# Patient Record
Sex: Male | Born: 1950 | Race: White | Hispanic: No
Health system: Southern US, Community
[De-identification: ages and names within clinical notes are randomized; demographics above are authoritative.]

---

## 2001-01-31 ENCOUNTER — Encounter: Payer: Self-pay | Admitting: Family Medicine

## 2001-01-31 ENCOUNTER — Ambulatory Visit (HOSPITAL_COMMUNITY): Admission: RE | Admit: 2001-01-31 | Discharge: 2001-01-31 | Payer: Self-pay | Admitting: Family Medicine

## 2001-03-14 ENCOUNTER — Ambulatory Visit (HOSPITAL_BASED_OUTPATIENT_CLINIC_OR_DEPARTMENT_OTHER): Admission: RE | Admit: 2001-03-14 | Discharge: 2001-03-14 | Payer: Self-pay | Admitting: Orthopaedic Surgery

## 2002-12-12 ENCOUNTER — Ambulatory Visit (HOSPITAL_BASED_OUTPATIENT_CLINIC_OR_DEPARTMENT_OTHER): Admission: RE | Admit: 2002-12-12 | Discharge: 2002-12-12 | Payer: Self-pay | Admitting: Internal Medicine

## 2006-09-21 ENCOUNTER — Ambulatory Visit (HOSPITAL_BASED_OUTPATIENT_CLINIC_OR_DEPARTMENT_OTHER): Admission: RE | Admit: 2006-09-21 | Discharge: 2006-09-21 | Payer: Self-pay | Admitting: Family Medicine

## 2006-09-25 ENCOUNTER — Ambulatory Visit: Payer: Self-pay | Admitting: Internal Medicine

## 2012-11-21 DIAGNOSIS — E785 Hyperlipidemia, unspecified: Secondary | ICD-10-CM | POA: Insufficient documentation

## 2015-09-21 DIAGNOSIS — H66001 Acute suppurative otitis media without spontaneous rupture of ear drum, right ear: Secondary | ICD-10-CM | POA: Diagnosis not present

## 2015-09-21 DIAGNOSIS — J029 Acute pharyngitis, unspecified: Secondary | ICD-10-CM | POA: Diagnosis not present

## 2015-12-29 DIAGNOSIS — G4733 Obstructive sleep apnea (adult) (pediatric): Secondary | ICD-10-CM | POA: Insufficient documentation

## 2016-01-06 DIAGNOSIS — G4733 Obstructive sleep apnea (adult) (pediatric): Secondary | ICD-10-CM | POA: Diagnosis not present

## 2016-01-06 DIAGNOSIS — R0683 Snoring: Secondary | ICD-10-CM | POA: Diagnosis not present

## 2016-01-06 DIAGNOSIS — R400 Somnolence: Secondary | ICD-10-CM | POA: Diagnosis not present

## 2016-02-03 DIAGNOSIS — S99921A Unspecified injury of right foot, initial encounter: Secondary | ICD-10-CM | POA: Diagnosis not present

## 2016-02-03 DIAGNOSIS — S92511A Displaced fracture of proximal phalanx of right lesser toe(s), initial encounter for closed fracture: Secondary | ICD-10-CM | POA: Diagnosis not present

## 2016-02-03 DIAGNOSIS — N400 Enlarged prostate without lower urinary tract symptoms: Secondary | ICD-10-CM | POA: Diagnosis not present

## 2016-02-05 DIAGNOSIS — S92514A Nondisplaced fracture of proximal phalanx of right lesser toe(s), initial encounter for closed fracture: Secondary | ICD-10-CM | POA: Diagnosis not present

## 2016-03-05 DIAGNOSIS — R3911 Hesitancy of micturition: Secondary | ICD-10-CM | POA: Diagnosis not present

## 2016-03-05 DIAGNOSIS — L309 Dermatitis, unspecified: Secondary | ICD-10-CM | POA: Diagnosis not present

## 2016-03-05 DIAGNOSIS — Z125 Encounter for screening for malignant neoplasm of prostate: Secondary | ICD-10-CM | POA: Diagnosis not present

## 2016-03-05 DIAGNOSIS — Z1211 Encounter for screening for malignant neoplasm of colon: Secondary | ICD-10-CM | POA: Diagnosis not present

## 2016-03-05 DIAGNOSIS — E7800 Pure hypercholesterolemia, unspecified: Secondary | ICD-10-CM | POA: Diagnosis not present

## 2016-03-05 DIAGNOSIS — Z23 Encounter for immunization: Secondary | ICD-10-CM | POA: Diagnosis not present

## 2016-03-05 DIAGNOSIS — F5101 Primary insomnia: Secondary | ICD-10-CM | POA: Diagnosis not present

## 2016-03-05 DIAGNOSIS — Z0000 Encounter for general adult medical examination without abnormal findings: Secondary | ICD-10-CM | POA: Diagnosis not present

## 2016-03-05 DIAGNOSIS — K219 Gastro-esophageal reflux disease without esophagitis: Secondary | ICD-10-CM | POA: Diagnosis not present

## 2016-03-05 DIAGNOSIS — N401 Enlarged prostate with lower urinary tract symptoms: Secondary | ICD-10-CM | POA: Diagnosis not present

## 2016-03-05 DIAGNOSIS — Z1159 Encounter for screening for other viral diseases: Secondary | ICD-10-CM | POA: Diagnosis not present

## 2016-04-01 DIAGNOSIS — D2239 Melanocytic nevi of other parts of face: Secondary | ICD-10-CM | POA: Diagnosis not present

## 2016-04-01 DIAGNOSIS — L2084 Intrinsic (allergic) eczema: Secondary | ICD-10-CM | POA: Diagnosis not present

## 2016-04-01 DIAGNOSIS — L821 Other seborrheic keratosis: Secondary | ICD-10-CM | POA: Diagnosis not present

## 2016-04-21 DIAGNOSIS — R972 Elevated prostate specific antigen [PSA]: Secondary | ICD-10-CM | POA: Diagnosis not present

## 2016-09-08 DIAGNOSIS — D696 Thrombocytopenia, unspecified: Secondary | ICD-10-CM | POA: Diagnosis not present

## 2016-10-26 DIAGNOSIS — K5730 Diverticulosis of large intestine without perforation or abscess without bleeding: Secondary | ICD-10-CM | POA: Diagnosis not present

## 2016-10-26 DIAGNOSIS — Z1211 Encounter for screening for malignant neoplasm of colon: Secondary | ICD-10-CM | POA: Diagnosis not present

## 2017-03-10 DIAGNOSIS — Z23 Encounter for immunization: Secondary | ICD-10-CM | POA: Diagnosis not present

## 2017-03-31 DIAGNOSIS — H2513 Age-related nuclear cataract, bilateral: Secondary | ICD-10-CM | POA: Diagnosis not present

## 2017-03-31 DIAGNOSIS — H25013 Cortical age-related cataract, bilateral: Secondary | ICD-10-CM | POA: Diagnosis not present

## 2017-07-19 DIAGNOSIS — K219 Gastro-esophageal reflux disease without esophagitis: Secondary | ICD-10-CM | POA: Diagnosis not present

## 2017-07-19 DIAGNOSIS — L989 Disorder of the skin and subcutaneous tissue, unspecified: Secondary | ICD-10-CM | POA: Diagnosis not present

## 2017-07-19 DIAGNOSIS — F5101 Primary insomnia: Secondary | ICD-10-CM | POA: Diagnosis not present

## 2017-07-19 DIAGNOSIS — Z125 Encounter for screening for malignant neoplasm of prostate: Secondary | ICD-10-CM | POA: Diagnosis not present

## 2017-07-19 DIAGNOSIS — Z0000 Encounter for general adult medical examination without abnormal findings: Secondary | ICD-10-CM | POA: Diagnosis not present

## 2017-07-19 DIAGNOSIS — N401 Enlarged prostate with lower urinary tract symptoms: Secondary | ICD-10-CM | POA: Diagnosis not present

## 2017-07-19 DIAGNOSIS — E7800 Pure hypercholesterolemia, unspecified: Secondary | ICD-10-CM | POA: Diagnosis not present

## 2017-07-19 DIAGNOSIS — Z23 Encounter for immunization: Secondary | ICD-10-CM | POA: Diagnosis not present

## 2017-09-14 DIAGNOSIS — R972 Elevated prostate specific antigen [PSA]: Secondary | ICD-10-CM | POA: Diagnosis not present

## 2017-09-14 DIAGNOSIS — N400 Enlarged prostate without lower urinary tract symptoms: Secondary | ICD-10-CM | POA: Diagnosis not present

## 2017-09-27 DIAGNOSIS — L821 Other seborrheic keratosis: Secondary | ICD-10-CM | POA: Diagnosis not present

## 2017-09-27 DIAGNOSIS — B353 Tinea pedis: Secondary | ICD-10-CM | POA: Diagnosis not present

## 2017-09-27 DIAGNOSIS — L309 Dermatitis, unspecified: Secondary | ICD-10-CM | POA: Diagnosis not present

## 2017-11-17 ENCOUNTER — Other Ambulatory Visit: Payer: Self-pay | Admitting: Family Medicine

## 2017-11-17 ENCOUNTER — Ambulatory Visit
Admission: RE | Admit: 2017-11-17 | Discharge: 2017-11-17 | Disposition: A | Discharge status: Home / Self Care | Payer: Medicare Other | Source: Ambulatory Visit | Attending: Family Medicine | Admitting: Family Medicine

## 2017-11-17 DIAGNOSIS — R059 Cough, unspecified: Secondary | ICD-10-CM

## 2017-11-17 DIAGNOSIS — R05 Cough: Secondary | ICD-10-CM

## 2018-03-20 DIAGNOSIS — J069 Acute upper respiratory infection, unspecified: Secondary | ICD-10-CM | POA: Diagnosis not present

## 2018-04-13 DIAGNOSIS — Z23 Encounter for immunization: Secondary | ICD-10-CM | POA: Diagnosis not present

## 2018-04-27 DIAGNOSIS — H25013 Cortical age-related cataract, bilateral: Secondary | ICD-10-CM | POA: Diagnosis not present

## 2018-04-27 DIAGNOSIS — H2513 Age-related nuclear cataract, bilateral: Secondary | ICD-10-CM | POA: Diagnosis not present

## 2018-07-20 DIAGNOSIS — E7800 Pure hypercholesterolemia, unspecified: Secondary | ICD-10-CM | POA: Diagnosis not present

## 2018-07-20 DIAGNOSIS — N401 Enlarged prostate with lower urinary tract symptoms: Secondary | ICD-10-CM | POA: Diagnosis not present

## 2018-07-20 DIAGNOSIS — R030 Elevated blood-pressure reading, without diagnosis of hypertension: Secondary | ICD-10-CM | POA: Diagnosis not present

## 2018-07-20 DIAGNOSIS — K219 Gastro-esophageal reflux disease without esophagitis: Secondary | ICD-10-CM | POA: Diagnosis not present

## 2018-07-20 DIAGNOSIS — F5101 Primary insomnia: Secondary | ICD-10-CM | POA: Diagnosis not present

## 2018-07-20 DIAGNOSIS — Z0000 Encounter for general adult medical examination without abnormal findings: Secondary | ICD-10-CM | POA: Diagnosis not present

## 2018-08-18 DIAGNOSIS — R7309 Other abnormal glucose: Secondary | ICD-10-CM | POA: Diagnosis not present

## 2018-08-18 DIAGNOSIS — N289 Disorder of kidney and ureter, unspecified: Secondary | ICD-10-CM | POA: Diagnosis not present

## 2018-08-30 DIAGNOSIS — R972 Elevated prostate specific antigen [PSA]: Secondary | ICD-10-CM | POA: Diagnosis not present

## 2018-08-30 DIAGNOSIS — N400 Enlarged prostate without lower urinary tract symptoms: Secondary | ICD-10-CM | POA: Diagnosis not present

## 2018-09-21 DIAGNOSIS — N183 Chronic kidney disease, stage 3 (moderate): Secondary | ICD-10-CM | POA: Diagnosis not present

## 2019-01-24 DIAGNOSIS — Z630 Problems in relationship with spouse or partner: Secondary | ICD-10-CM | POA: Diagnosis not present

## 2019-01-24 DIAGNOSIS — F419 Anxiety disorder, unspecified: Secondary | ICD-10-CM | POA: Diagnosis not present

## 2019-02-08 DIAGNOSIS — F419 Anxiety disorder, unspecified: Secondary | ICD-10-CM | POA: Diagnosis not present

## 2019-02-08 DIAGNOSIS — Z23 Encounter for immunization: Secondary | ICD-10-CM | POA: Diagnosis not present

## 2019-02-08 DIAGNOSIS — F321 Major depressive disorder, single episode, moderate: Secondary | ICD-10-CM | POA: Diagnosis not present

## 2019-03-14 DIAGNOSIS — F419 Anxiety disorder, unspecified: Secondary | ICD-10-CM | POA: Diagnosis not present

## 2019-03-14 DIAGNOSIS — F5101 Primary insomnia: Secondary | ICD-10-CM | POA: Diagnosis not present

## 2019-03-14 DIAGNOSIS — F321 Major depressive disorder, single episode, moderate: Secondary | ICD-10-CM | POA: Diagnosis not present

## 2019-07-04 DIAGNOSIS — Z20828 Contact with and (suspected) exposure to other viral communicable diseases: Secondary | ICD-10-CM | POA: Diagnosis not present

## 2019-07-22 ENCOUNTER — Ambulatory Visit: Payer: Medicare Other | Attending: Internal Medicine

## 2019-07-22 DIAGNOSIS — Z23 Encounter for immunization: Secondary | ICD-10-CM | POA: Insufficient documentation

## 2019-07-22 NOTE — Progress Notes (Signed)
   Covid-19 Vaccination Clinic  Name:  Walter Combs    MRN: [id number] DOB: February 24, 1951  07/22/2019  Walter Combs was observed post Covid-19 immunization for 15 minutes without incidence. He was provided with Vaccine Information Sheet and instruction to access the V-Safe system.   Walter Combs was instructed to call 911 with any severe reactions post vaccine: Marland Kitchen Difficulty breathing  . Swelling of your face and throat  . A fast heartbeat  . A bad rash all over your body  . Dizziness and weakness    Immunizations Administered    Name Date Dose VIS Date Route   Pfizer COVID-19 Vaccine 07/22/2019 10:54 AM 0.3 mL 05/04/2019 Intramuscular   Manufacturer: [place]   Lot: [id number]   [place]: [id number]

## 2019-07-30 DIAGNOSIS — B356 Tinea cruris: Secondary | ICD-10-CM | POA: Diagnosis not present

## 2019-07-30 DIAGNOSIS — F5101 Primary insomnia: Secondary | ICD-10-CM | POA: Diagnosis not present

## 2019-07-30 DIAGNOSIS — Z0000 Encounter for general adult medical examination without abnormal findings: Secondary | ICD-10-CM | POA: Diagnosis not present

## 2019-07-30 DIAGNOSIS — E7800 Pure hypercholesterolemia, unspecified: Secondary | ICD-10-CM | POA: Diagnosis not present

## 2019-07-30 DIAGNOSIS — N401 Enlarged prostate with lower urinary tract symptoms: Secondary | ICD-10-CM | POA: Diagnosis not present

## 2019-07-30 DIAGNOSIS — I10 Essential (primary) hypertension: Secondary | ICD-10-CM | POA: Diagnosis not present

## 2019-07-30 DIAGNOSIS — N189 Chronic kidney disease, unspecified: Secondary | ICD-10-CM | POA: Diagnosis not present

## 2019-07-30 DIAGNOSIS — K219 Gastro-esophageal reflux disease without esophagitis: Secondary | ICD-10-CM | POA: Diagnosis not present

## 2019-07-30 DIAGNOSIS — F321 Major depressive disorder, single episode, moderate: Secondary | ICD-10-CM | POA: Diagnosis not present

## 2019-08-13 ENCOUNTER — Ambulatory Visit: Payer: Medicare Other | Attending: Internal Medicine

## 2019-08-13 DIAGNOSIS — Z23 Encounter for immunization: Secondary | ICD-10-CM

## 2019-08-13 NOTE — Progress Notes (Signed)
   Covid-19 Vaccination Clinic  Name:  Jadell Veltri    MRN: [id number] DOB: 02-05-1951  08/13/2019  Mr. Zarcone was observed post Covid-19 immunization for 15 minutes without incident. He was provided with Vaccine Information Sheet and instruction to access the V-Safe system.   Mr. Spizzirri was instructed to call 911 with any severe reactions post vaccine: Marland Kitchen Difficulty breathing  . Swelling of face and throat  . A fast heartbeat  . A bad rash all over body  . Dizziness and weakness   Immunizations Administered    Name Date Dose VIS Date Route   Pfizer COVID-19 Vaccine 08/13/2019 10:10 AM 0.3 mL 05/04/2019 Intramuscular   Manufacturer: [employer]   Lot: [id number]   [place]: [id number]

## 2019-09-10 DIAGNOSIS — R972 Elevated prostate specific antigen [PSA]: Secondary | ICD-10-CM | POA: Diagnosis not present

## 2019-11-20 IMAGING — CR DG CHEST 2V
2 series · 2 of 2 positions shown · non-contrast
Comparison: None.

CLINICAL DATA: Onset cough without URI x 3 months / suspects pollen
allergy / concern for infiltrate / jdh 315

EXAM:
CHEST - 2 VIEW

[w chest pa]
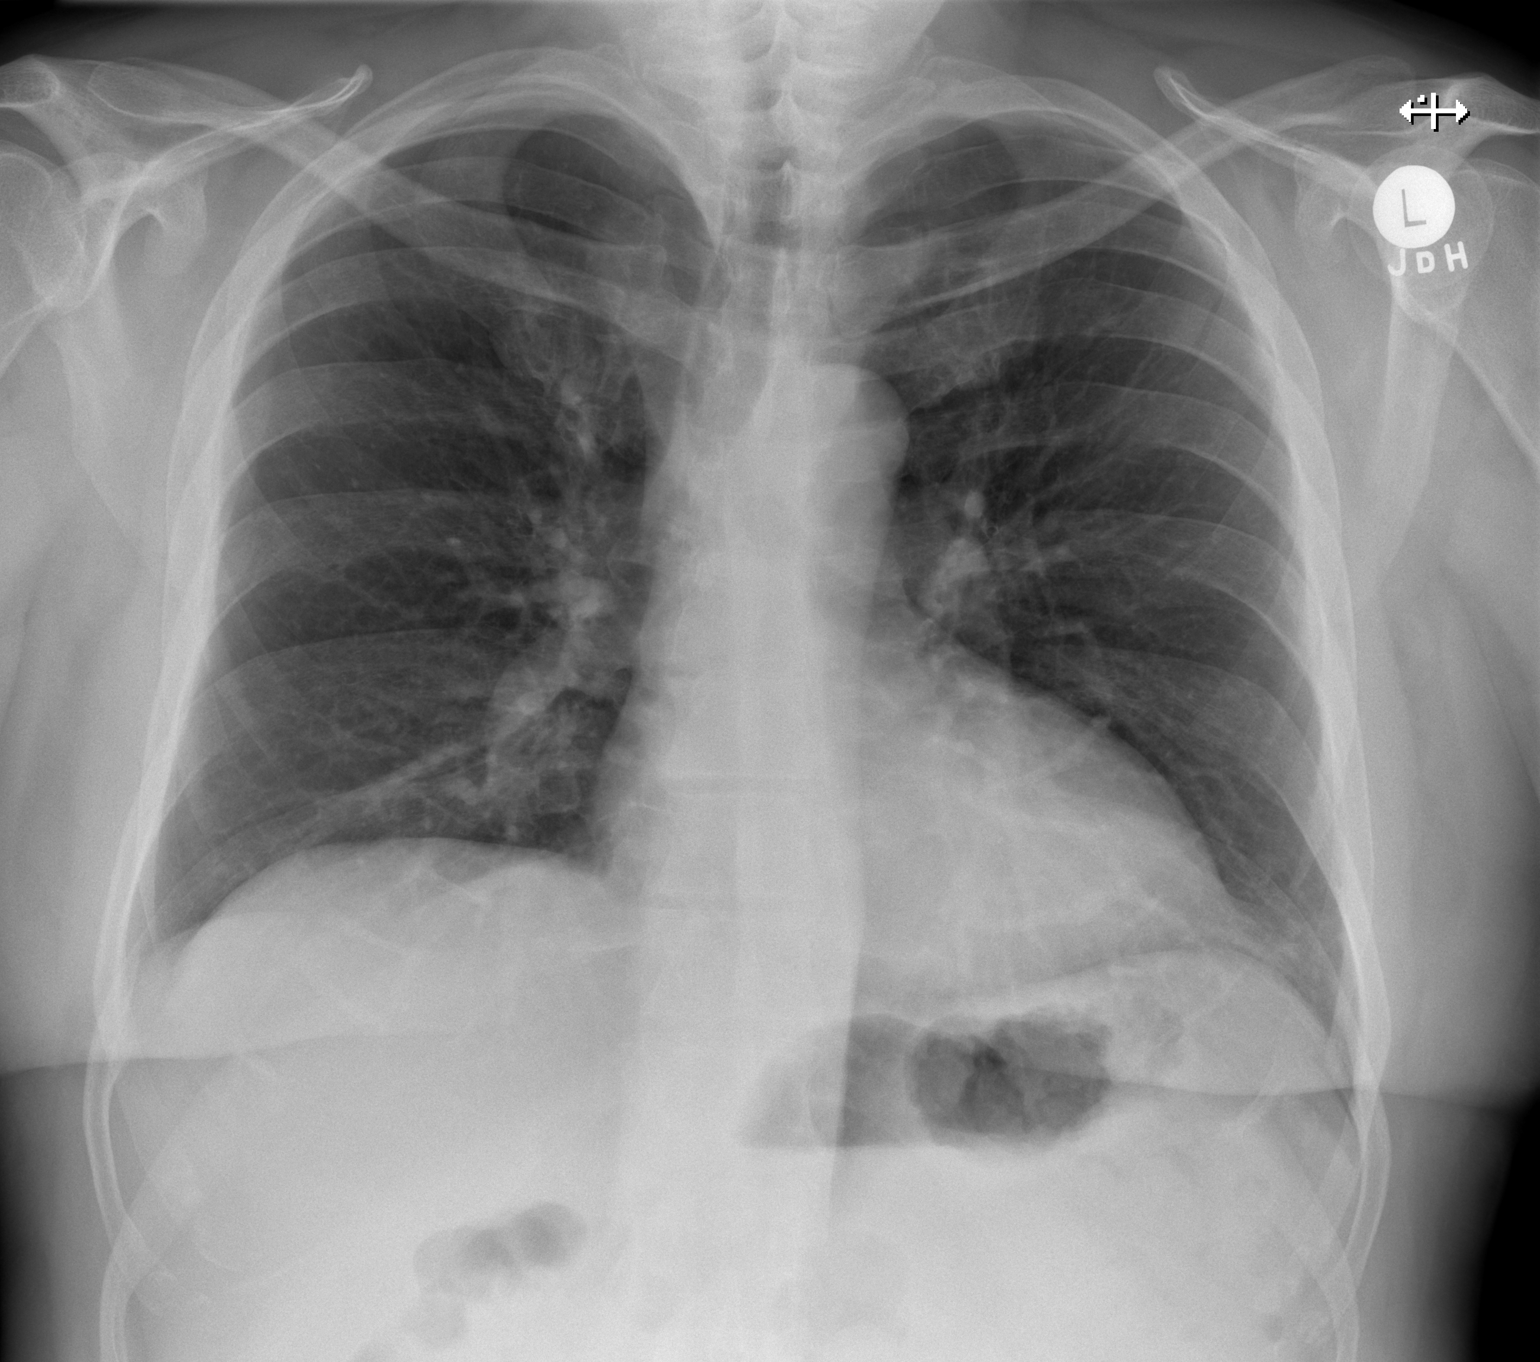

[w chest lat]
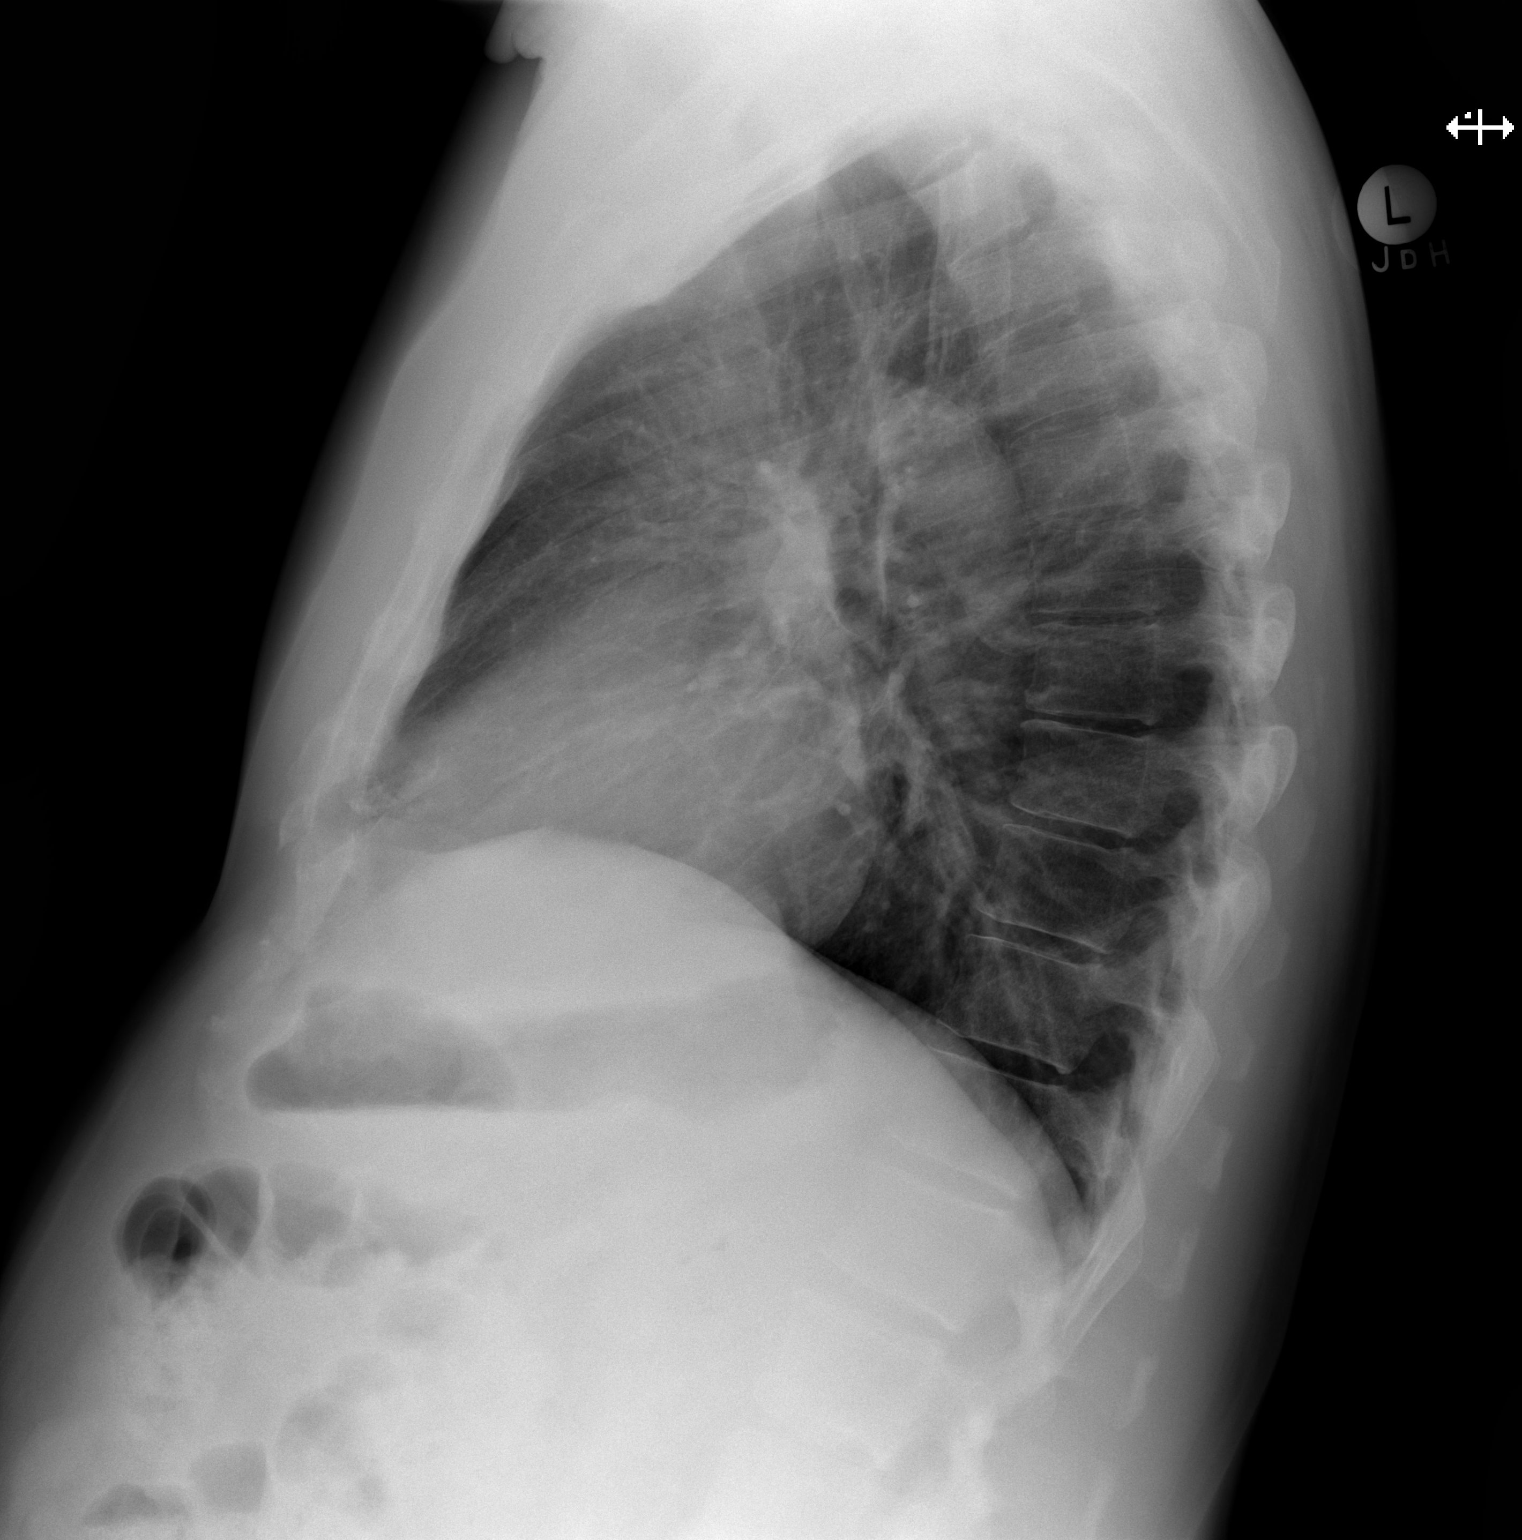

[2 of 2 positions shown; findings below may reference images not displayed]

FINDINGS: Midline trachea. Normal heart size and mediastinal contours. No
pleural effusion or pneumothorax. Clear lungs.
IMPRESSION: No acute cardiopulmonary disease.

## 2020-01-22 DIAGNOSIS — I10 Essential (primary) hypertension: Secondary | ICD-10-CM | POA: Diagnosis not present

## 2020-01-22 DIAGNOSIS — F321 Major depressive disorder, single episode, moderate: Secondary | ICD-10-CM | POA: Diagnosis not present

## 2020-01-22 DIAGNOSIS — N189 Chronic kidney disease, unspecified: Secondary | ICD-10-CM | POA: Diagnosis not present

## 2020-01-22 DIAGNOSIS — E7800 Pure hypercholesterolemia, unspecified: Secondary | ICD-10-CM | POA: Diagnosis not present

## 2020-01-22 DIAGNOSIS — N401 Enlarged prostate with lower urinary tract symptoms: Secondary | ICD-10-CM | POA: Diagnosis not present

## 2020-02-14 DIAGNOSIS — I10 Essential (primary) hypertension: Secondary | ICD-10-CM | POA: Diagnosis not present

## 2020-02-14 DIAGNOSIS — N401 Enlarged prostate with lower urinary tract symptoms: Secondary | ICD-10-CM | POA: Diagnosis not present

## 2020-02-14 DIAGNOSIS — E7800 Pure hypercholesterolemia, unspecified: Secondary | ICD-10-CM | POA: Diagnosis not present

## 2020-02-14 DIAGNOSIS — N189 Chronic kidney disease, unspecified: Secondary | ICD-10-CM | POA: Diagnosis not present

## 2020-02-14 DIAGNOSIS — F321 Major depressive disorder, single episode, moderate: Secondary | ICD-10-CM | POA: Diagnosis not present

## 2020-02-27 DIAGNOSIS — Z23 Encounter for immunization: Secondary | ICD-10-CM | POA: Diagnosis not present

## 2020-03-06 DIAGNOSIS — Z23 Encounter for immunization: Secondary | ICD-10-CM | POA: Diagnosis not present

## 2020-03-10 DIAGNOSIS — R972 Elevated prostate specific antigen [PSA]: Secondary | ICD-10-CM | POA: Diagnosis not present

## 2020-06-17 DIAGNOSIS — E7800 Pure hypercholesterolemia, unspecified: Secondary | ICD-10-CM | POA: Diagnosis not present

## 2020-06-17 DIAGNOSIS — I10 Essential (primary) hypertension: Secondary | ICD-10-CM | POA: Diagnosis not present

## 2020-06-17 DIAGNOSIS — K219 Gastro-esophageal reflux disease without esophagitis: Secondary | ICD-10-CM | POA: Diagnosis not present

## 2020-06-17 DIAGNOSIS — F321 Major depressive disorder, single episode, moderate: Secondary | ICD-10-CM | POA: Diagnosis not present

## 2020-06-17 DIAGNOSIS — N189 Chronic kidney disease, unspecified: Secondary | ICD-10-CM | POA: Diagnosis not present

## 2020-06-17 DIAGNOSIS — N401 Enlarged prostate with lower urinary tract symptoms: Secondary | ICD-10-CM | POA: Diagnosis not present

## 2020-06-24 DIAGNOSIS — H25013 Cortical age-related cataract, bilateral: Secondary | ICD-10-CM | POA: Diagnosis not present

## 2020-06-24 DIAGNOSIS — H2513 Age-related nuclear cataract, bilateral: Secondary | ICD-10-CM | POA: Diagnosis not present

## 2020-07-30 ENCOUNTER — Ambulatory Visit
Admission: RE | Admit: 2020-07-30 | Discharge: 2020-07-30 | Disposition: A | Discharge status: Home / Self Care | Payer: Medicare Other | Source: Ambulatory Visit | Attending: Family Medicine | Admitting: Family Medicine

## 2020-07-30 ENCOUNTER — Other Ambulatory Visit: Payer: Self-pay | Admitting: Family Medicine

## 2020-07-30 ENCOUNTER — Other Ambulatory Visit: Payer: Self-pay

## 2020-07-30 DIAGNOSIS — R198 Other specified symptoms and signs involving the digestive system and abdomen: Secondary | ICD-10-CM | POA: Diagnosis not present

## 2020-07-30 DIAGNOSIS — J3489 Other specified disorders of nose and nasal sinuses: Secondary | ICD-10-CM | POA: Diagnosis not present

## 2020-07-30 DIAGNOSIS — N401 Enlarged prostate with lower urinary tract symptoms: Secondary | ICD-10-CM | POA: Diagnosis not present

## 2020-07-30 DIAGNOSIS — Z0000 Encounter for general adult medical examination without abnormal findings: Secondary | ICD-10-CM | POA: Diagnosis not present

## 2020-07-30 DIAGNOSIS — I10 Essential (primary) hypertension: Secondary | ICD-10-CM | POA: Diagnosis not present

## 2020-07-30 DIAGNOSIS — M47816 Spondylosis without myelopathy or radiculopathy, lumbar region: Secondary | ICD-10-CM | POA: Diagnosis not present

## 2020-07-30 DIAGNOSIS — M160 Bilateral primary osteoarthritis of hip: Secondary | ICD-10-CM | POA: Diagnosis not present

## 2020-07-30 DIAGNOSIS — E7800 Pure hypercholesterolemia, unspecified: Secondary | ICD-10-CM | POA: Diagnosis not present

## 2020-07-30 DIAGNOSIS — N1831 Chronic kidney disease, stage 3a: Secondary | ICD-10-CM | POA: Diagnosis not present

## 2020-07-30 DIAGNOSIS — H9193 Unspecified hearing loss, bilateral: Secondary | ICD-10-CM | POA: Diagnosis not present

## 2020-07-30 DIAGNOSIS — G4733 Obstructive sleep apnea (adult) (pediatric): Secondary | ICD-10-CM | POA: Diagnosis not present

## 2020-07-30 DIAGNOSIS — K5900 Constipation, unspecified: Secondary | ICD-10-CM | POA: Diagnosis not present

## 2020-07-30 DIAGNOSIS — K567 Ileus, unspecified: Secondary | ICD-10-CM | POA: Diagnosis not present

## 2020-07-31 ENCOUNTER — Other Ambulatory Visit: Payer: Self-pay | Admitting: Family Medicine

## 2020-07-31 DIAGNOSIS — M7989 Other specified soft tissue disorders: Secondary | ICD-10-CM

## 2020-08-15 DIAGNOSIS — N401 Enlarged prostate with lower urinary tract symptoms: Secondary | ICD-10-CM | POA: Diagnosis not present

## 2020-08-15 DIAGNOSIS — I10 Essential (primary) hypertension: Secondary | ICD-10-CM | POA: Diagnosis not present

## 2020-08-15 DIAGNOSIS — F321 Major depressive disorder, single episode, moderate: Secondary | ICD-10-CM | POA: Diagnosis not present

## 2020-08-15 DIAGNOSIS — E7800 Pure hypercholesterolemia, unspecified: Secondary | ICD-10-CM | POA: Diagnosis not present

## 2020-08-15 DIAGNOSIS — N131 Hydronephrosis with ureteral stricture, not elsewhere classified: Secondary | ICD-10-CM | POA: Diagnosis not present

## 2020-08-15 DIAGNOSIS — K219 Gastro-esophageal reflux disease without esophagitis: Secondary | ICD-10-CM | POA: Diagnosis not present

## 2020-08-15 DIAGNOSIS — N1831 Chronic kidney disease, stage 3a: Secondary | ICD-10-CM | POA: Diagnosis not present

## 2020-08-18 ENCOUNTER — Other Ambulatory Visit: Payer: Self-pay

## 2020-08-18 ENCOUNTER — Ambulatory Visit
Admission: RE | Admit: 2020-08-18 | Discharge: 2020-08-18 | Disposition: A | Discharge status: Home / Self Care | Payer: Medicare Other | Source: Ambulatory Visit | Attending: Family Medicine | Admitting: Family Medicine

## 2020-08-18 DIAGNOSIS — N3289 Other specified disorders of bladder: Secondary | ICD-10-CM | POA: Diagnosis not present

## 2020-08-18 DIAGNOSIS — M7989 Other specified soft tissue disorders: Secondary | ICD-10-CM

## 2020-08-18 DIAGNOSIS — K5900 Constipation, unspecified: Secondary | ICD-10-CM | POA: Diagnosis not present

## 2020-08-18 DIAGNOSIS — N400 Enlarged prostate without lower urinary tract symptoms: Secondary | ICD-10-CM | POA: Diagnosis not present

## 2020-08-18 DIAGNOSIS — N1330 Unspecified hydronephrosis: Secondary | ICD-10-CM | POA: Diagnosis not present

## 2020-08-18 MED ORDER — IOPAMIDOL (ISOVUE-300) INJECTION 61%
100.0000 mL | Freq: Once | INTRAVENOUS | Status: AC | PRN
  Administered 2020-08-18: 100 mL via INTRAVENOUS

## 2020-08-19 DIAGNOSIS — H903 Sensorineural hearing loss, bilateral: Secondary | ICD-10-CM | POA: Diagnosis not present

## 2020-09-07 DIAGNOSIS — N320 Bladder-neck obstruction: Secondary | ICD-10-CM | POA: Diagnosis present

## 2020-09-07 DIAGNOSIS — N1339 Other hydronephrosis: Secondary | ICD-10-CM | POA: Diagnosis not present

## 2020-09-07 DIAGNOSIS — N179 Acute kidney failure, unspecified: Secondary | ICD-10-CM | POA: Diagnosis not present

## 2020-09-07 DIAGNOSIS — R600 Localized edema: Secondary | ICD-10-CM | POA: Diagnosis not present

## 2020-09-07 DIAGNOSIS — C61 Malignant neoplasm of prostate: Secondary | ICD-10-CM | POA: Diagnosis present

## 2020-09-07 DIAGNOSIS — N1330 Unspecified hydronephrosis: Secondary | ICD-10-CM | POA: Diagnosis present

## 2020-09-07 DIAGNOSIS — R3589 Other polyuria: Secondary | ICD-10-CM | POA: Diagnosis present

## 2020-09-07 DIAGNOSIS — R509 Fever, unspecified: Secondary | ICD-10-CM | POA: Diagnosis not present

## 2020-09-07 DIAGNOSIS — N419 Inflammatory disease of prostate, unspecified: Secondary | ICD-10-CM | POA: Diagnosis present

## 2020-09-07 DIAGNOSIS — R000 Tachycardia, unspecified: Secondary | ICD-10-CM | POA: Diagnosis not present

## 2020-09-07 DIAGNOSIS — I10 Essential (primary) hypertension: Secondary | ICD-10-CM | POA: Diagnosis not present

## 2020-09-07 DIAGNOSIS — L03115 Cellulitis of right lower limb: Secondary | ICD-10-CM | POA: Diagnosis present

## 2020-09-07 DIAGNOSIS — G4700 Insomnia, unspecified: Secondary | ICD-10-CM | POA: Diagnosis present

## 2020-09-07 DIAGNOSIS — M79604 Pain in right leg: Secondary | ICD-10-CM | POA: Diagnosis not present

## 2020-09-07 DIAGNOSIS — Z7901 Long term (current) use of anticoagulants: Secondary | ICD-10-CM | POA: Diagnosis not present

## 2020-09-07 DIAGNOSIS — N400 Enlarged prostate without lower urinary tract symptoms: Secondary | ICD-10-CM | POA: Diagnosis not present

## 2020-09-07 DIAGNOSIS — Z20822 Contact with and (suspected) exposure to covid-19: Secondary | ICD-10-CM | POA: Diagnosis not present

## 2020-09-07 DIAGNOSIS — N189 Chronic kidney disease, unspecified: Secondary | ICD-10-CM | POA: Diagnosis present

## 2020-09-07 DIAGNOSIS — N3289 Other specified disorders of bladder: Secondary | ICD-10-CM | POA: Diagnosis not present

## 2020-09-07 DIAGNOSIS — R140 Abdominal distension (gaseous): Secondary | ICD-10-CM | POA: Diagnosis not present

## 2020-09-07 DIAGNOSIS — M7989 Other specified soft tissue disorders: Secondary | ICD-10-CM | POA: Diagnosis not present

## 2020-09-07 DIAGNOSIS — R339 Retention of urine, unspecified: Secondary | ICD-10-CM | POA: Diagnosis not present

## 2020-09-07 DIAGNOSIS — E785 Hyperlipidemia, unspecified: Secondary | ICD-10-CM | POA: Diagnosis present

## 2020-09-07 DIAGNOSIS — N401 Enlarged prostate with lower urinary tract symptoms: Secondary | ICD-10-CM | POA: Diagnosis present

## 2020-09-07 DIAGNOSIS — N138 Other obstructive and reflux uropathy: Secondary | ICD-10-CM | POA: Diagnosis present

## 2020-09-07 DIAGNOSIS — N281 Cyst of kidney, acquired: Secondary | ICD-10-CM | POA: Diagnosis not present

## 2020-09-07 DIAGNOSIS — I129 Hypertensive chronic kidney disease with stage 1 through stage 4 chronic kidney disease, or unspecified chronic kidney disease: Secondary | ICD-10-CM | POA: Diagnosis present

## 2020-09-12 DIAGNOSIS — N130 Hydronephrosis with ureteropelvic junction obstruction: Secondary | ICD-10-CM | POA: Diagnosis not present

## 2020-09-12 DIAGNOSIS — R338 Other retention of urine: Secondary | ICD-10-CM | POA: Diagnosis not present

## 2020-09-22 DIAGNOSIS — R338 Other retention of urine: Secondary | ICD-10-CM | POA: Diagnosis not present

## 2020-09-22 DIAGNOSIS — N130 Hydronephrosis with ureteropelvic junction obstruction: Secondary | ICD-10-CM | POA: Diagnosis not present

## 2020-09-22 DIAGNOSIS — N401 Enlarged prostate with lower urinary tract symptoms: Secondary | ICD-10-CM | POA: Diagnosis not present

## 2020-10-22 DIAGNOSIS — R338 Other retention of urine: Secondary | ICD-10-CM | POA: Diagnosis not present

## 2020-10-29 DIAGNOSIS — N130 Hydronephrosis with ureteropelvic junction obstruction: Secondary | ICD-10-CM | POA: Diagnosis not present

## 2020-10-29 DIAGNOSIS — N312 Flaccid neuropathic bladder, not elsewhere classified: Secondary | ICD-10-CM | POA: Diagnosis not present

## 2020-10-29 DIAGNOSIS — R338 Other retention of urine: Secondary | ICD-10-CM | POA: Diagnosis not present

## 2020-10-29 DIAGNOSIS — N401 Enlarged prostate with lower urinary tract symptoms: Secondary | ICD-10-CM | POA: Diagnosis not present

## 2020-10-29 DIAGNOSIS — R972 Elevated prostate specific antigen [PSA]: Secondary | ICD-10-CM | POA: Diagnosis not present

## 2020-11-17 DIAGNOSIS — I10 Essential (primary) hypertension: Secondary | ICD-10-CM | POA: Diagnosis not present

## 2020-11-17 DIAGNOSIS — F321 Major depressive disorder, single episode, moderate: Secondary | ICD-10-CM | POA: Diagnosis not present

## 2020-11-17 DIAGNOSIS — K219 Gastro-esophageal reflux disease without esophagitis: Secondary | ICD-10-CM | POA: Diagnosis not present

## 2020-11-17 DIAGNOSIS — N131 Hydronephrosis with ureteral stricture, not elsewhere classified: Secondary | ICD-10-CM | POA: Diagnosis not present

## 2020-11-17 DIAGNOSIS — N1831 Chronic kidney disease, stage 3a: Secondary | ICD-10-CM | POA: Diagnosis not present

## 2020-11-17 DIAGNOSIS — N401 Enlarged prostate with lower urinary tract symptoms: Secondary | ICD-10-CM | POA: Diagnosis not present

## 2020-11-17 DIAGNOSIS — E7800 Pure hypercholesterolemia, unspecified: Secondary | ICD-10-CM | POA: Diagnosis not present

## 2020-11-17 DIAGNOSIS — N189 Chronic kidney disease, unspecified: Secondary | ICD-10-CM | POA: Diagnosis not present

## 2020-11-20 DIAGNOSIS — L600 Ingrowing nail: Secondary | ICD-10-CM | POA: Diagnosis not present

## 2020-11-24 DIAGNOSIS — L600 Ingrowing nail: Secondary | ICD-10-CM | POA: Diagnosis not present

## 2020-11-28 DIAGNOSIS — I10 Essential (primary) hypertension: Secondary | ICD-10-CM | POA: Diagnosis not present

## 2020-11-28 DIAGNOSIS — N401 Enlarged prostate with lower urinary tract symptoms: Secondary | ICD-10-CM | POA: Diagnosis not present

## 2020-11-28 DIAGNOSIS — E7800 Pure hypercholesterolemia, unspecified: Secondary | ICD-10-CM | POA: Diagnosis not present

## 2020-11-28 DIAGNOSIS — N131 Hydronephrosis with ureteral stricture, not elsewhere classified: Secondary | ICD-10-CM | POA: Diagnosis not present

## 2020-11-28 DIAGNOSIS — K219 Gastro-esophageal reflux disease without esophagitis: Secondary | ICD-10-CM | POA: Diagnosis not present

## 2020-11-28 DIAGNOSIS — N189 Chronic kidney disease, unspecified: Secondary | ICD-10-CM | POA: Diagnosis not present

## 2020-11-28 DIAGNOSIS — N1831 Chronic kidney disease, stage 3a: Secondary | ICD-10-CM | POA: Diagnosis not present

## 2020-11-28 DIAGNOSIS — F321 Major depressive disorder, single episode, moderate: Secondary | ICD-10-CM | POA: Diagnosis not present

## 2021-01-23 DIAGNOSIS — M1711 Unilateral primary osteoarthritis, right knee: Secondary | ICD-10-CM | POA: Diagnosis not present

## 2021-02-06 DIAGNOSIS — N401 Enlarged prostate with lower urinary tract symptoms: Secondary | ICD-10-CM | POA: Diagnosis not present

## 2021-02-06 DIAGNOSIS — R338 Other retention of urine: Secondary | ICD-10-CM | POA: Diagnosis not present

## 2021-02-06 DIAGNOSIS — R972 Elevated prostate specific antigen [PSA]: Secondary | ICD-10-CM | POA: Diagnosis not present

## 2021-02-06 DIAGNOSIS — N312 Flaccid neuropathic bladder, not elsewhere classified: Secondary | ICD-10-CM | POA: Diagnosis not present

## 2021-02-10 DIAGNOSIS — N401 Enlarged prostate with lower urinary tract symptoms: Secondary | ICD-10-CM | POA: Diagnosis not present

## 2021-02-10 DIAGNOSIS — N131 Hydronephrosis with ureteral stricture, not elsewhere classified: Secondary | ICD-10-CM | POA: Diagnosis not present

## 2021-02-10 DIAGNOSIS — F321 Major depressive disorder, single episode, moderate: Secondary | ICD-10-CM | POA: Diagnosis not present

## 2021-02-10 DIAGNOSIS — N1831 Chronic kidney disease, stage 3a: Secondary | ICD-10-CM | POA: Diagnosis not present

## 2021-02-10 DIAGNOSIS — E7800 Pure hypercholesterolemia, unspecified: Secondary | ICD-10-CM | POA: Diagnosis not present

## 2021-02-10 DIAGNOSIS — K219 Gastro-esophageal reflux disease without esophagitis: Secondary | ICD-10-CM | POA: Diagnosis not present

## 2021-02-10 DIAGNOSIS — I10 Essential (primary) hypertension: Secondary | ICD-10-CM | POA: Diagnosis not present

## 2021-02-17 DIAGNOSIS — M25661 Stiffness of right knee, not elsewhere classified: Secondary | ICD-10-CM | POA: Diagnosis not present

## 2021-02-17 DIAGNOSIS — R262 Difficulty in walking, not elsewhere classified: Secondary | ICD-10-CM | POA: Diagnosis not present

## 2021-02-17 DIAGNOSIS — M6281 Muscle weakness (generalized): Secondary | ICD-10-CM | POA: Diagnosis not present

## 2021-02-20 DIAGNOSIS — M1711 Unilateral primary osteoarthritis, right knee: Secondary | ICD-10-CM | POA: Diagnosis not present

## 2021-03-04 DIAGNOSIS — R972 Elevated prostate specific antigen [PSA]: Secondary | ICD-10-CM | POA: Diagnosis not present

## 2021-03-05 DIAGNOSIS — Z23 Encounter for immunization: Secondary | ICD-10-CM | POA: Diagnosis not present

## 2021-03-05 DIAGNOSIS — E7800 Pure hypercholesterolemia, unspecified: Secondary | ICD-10-CM | POA: Diagnosis not present

## 2021-03-05 DIAGNOSIS — I10 Essential (primary) hypertension: Secondary | ICD-10-CM | POA: Diagnosis not present

## 2021-03-05 DIAGNOSIS — N1831 Chronic kidney disease, stage 3a: Secondary | ICD-10-CM | POA: Diagnosis not present

## 2021-03-05 DIAGNOSIS — N401 Enlarged prostate with lower urinary tract symptoms: Secondary | ICD-10-CM | POA: Diagnosis not present

## 2021-03-05 DIAGNOSIS — F419 Anxiety disorder, unspecified: Secondary | ICD-10-CM | POA: Diagnosis not present

## 2021-03-05 DIAGNOSIS — F5101 Primary insomnia: Secondary | ICD-10-CM | POA: Diagnosis not present

## 2021-03-05 DIAGNOSIS — F321 Major depressive disorder, single episode, moderate: Secondary | ICD-10-CM | POA: Diagnosis not present

## 2021-04-21 DIAGNOSIS — F321 Major depressive disorder, single episode, moderate: Secondary | ICD-10-CM | POA: Diagnosis not present

## 2021-04-21 DIAGNOSIS — I10 Essential (primary) hypertension: Secondary | ICD-10-CM | POA: Diagnosis not present

## 2021-04-21 DIAGNOSIS — F419 Anxiety disorder, unspecified: Secondary | ICD-10-CM | POA: Diagnosis not present

## 2021-04-21 DIAGNOSIS — F5101 Primary insomnia: Secondary | ICD-10-CM | POA: Diagnosis not present

## 2021-04-21 DIAGNOSIS — E7800 Pure hypercholesterolemia, unspecified: Secondary | ICD-10-CM | POA: Diagnosis not present

## 2021-04-21 DIAGNOSIS — G4733 Obstructive sleep apnea (adult) (pediatric): Secondary | ICD-10-CM | POA: Diagnosis not present

## 2021-06-19 DIAGNOSIS — N312 Flaccid neuropathic bladder, not elsewhere classified: Secondary | ICD-10-CM | POA: Diagnosis not present

## 2021-06-19 DIAGNOSIS — N3000 Acute cystitis without hematuria: Secondary | ICD-10-CM | POA: Diagnosis not present

## 2021-06-30 DIAGNOSIS — H43812 Vitreous degeneration, left eye: Secondary | ICD-10-CM | POA: Diagnosis not present

## 2021-07-02 DIAGNOSIS — Z20822 Contact with and (suspected) exposure to covid-19: Secondary | ICD-10-CM | POA: Diagnosis not present

## 2021-07-27 DIAGNOSIS — Z20828 Contact with and (suspected) exposure to other viral communicable diseases: Secondary | ICD-10-CM | POA: Diagnosis not present

## 2021-07-28 DIAGNOSIS — H25013 Cortical age-related cataract, bilateral: Secondary | ICD-10-CM | POA: Diagnosis not present

## 2021-07-28 DIAGNOSIS — H43812 Vitreous degeneration, left eye: Secondary | ICD-10-CM | POA: Diagnosis not present

## 2021-07-28 DIAGNOSIS — H2513 Age-related nuclear cataract, bilateral: Secondary | ICD-10-CM | POA: Diagnosis not present

## 2021-07-31 DIAGNOSIS — Z20822 Contact with and (suspected) exposure to covid-19: Secondary | ICD-10-CM | POA: Diagnosis not present

## 2021-08-03 DIAGNOSIS — R972 Elevated prostate specific antigen [PSA]: Secondary | ICD-10-CM | POA: Diagnosis not present

## 2021-08-04 DIAGNOSIS — G4733 Obstructive sleep apnea (adult) (pediatric): Secondary | ICD-10-CM | POA: Diagnosis not present

## 2021-08-04 DIAGNOSIS — N3001 Acute cystitis with hematuria: Secondary | ICD-10-CM | POA: Diagnosis not present

## 2021-08-04 DIAGNOSIS — I10 Essential (primary) hypertension: Secondary | ICD-10-CM | POA: Diagnosis not present

## 2021-08-04 DIAGNOSIS — F419 Anxiety disorder, unspecified: Secondary | ICD-10-CM | POA: Diagnosis not present

## 2021-08-04 DIAGNOSIS — N401 Enlarged prostate with lower urinary tract symptoms: Secondary | ICD-10-CM | POA: Diagnosis not present

## 2021-08-04 DIAGNOSIS — N319 Neuromuscular dysfunction of bladder, unspecified: Secondary | ICD-10-CM | POA: Diagnosis not present

## 2021-08-04 DIAGNOSIS — F5101 Primary insomnia: Secondary | ICD-10-CM | POA: Diagnosis not present

## 2021-08-04 DIAGNOSIS — Z0000 Encounter for general adult medical examination without abnormal findings: Secondary | ICD-10-CM | POA: Diagnosis not present

## 2021-08-04 DIAGNOSIS — E7800 Pure hypercholesterolemia, unspecified: Secondary | ICD-10-CM | POA: Diagnosis not present

## 2021-08-04 DIAGNOSIS — R3989 Other symptoms and signs involving the genitourinary system: Secondary | ICD-10-CM | POA: Diagnosis not present

## 2021-08-04 DIAGNOSIS — K219 Gastro-esophageal reflux disease without esophagitis: Secondary | ICD-10-CM | POA: Diagnosis not present

## 2021-08-04 DIAGNOSIS — F321 Major depressive disorder, single episode, moderate: Secondary | ICD-10-CM | POA: Diagnosis not present

## 2021-08-05 DIAGNOSIS — N3000 Acute cystitis without hematuria: Secondary | ICD-10-CM | POA: Diagnosis not present

## 2021-08-05 DIAGNOSIS — N401 Enlarged prostate with lower urinary tract symptoms: Secondary | ICD-10-CM | POA: Diagnosis not present

## 2021-08-05 DIAGNOSIS — R972 Elevated prostate specific antigen [PSA]: Secondary | ICD-10-CM | POA: Diagnosis not present

## 2021-08-05 DIAGNOSIS — R338 Other retention of urine: Secondary | ICD-10-CM | POA: Diagnosis not present

## 2021-08-13 DIAGNOSIS — Z20822 Contact with and (suspected) exposure to covid-19: Secondary | ICD-10-CM | POA: Diagnosis not present

## 2021-08-14 DIAGNOSIS — Z20822 Contact with and (suspected) exposure to covid-19: Secondary | ICD-10-CM | POA: Diagnosis not present

## 2021-09-05 DIAGNOSIS — Z20822 Contact with and (suspected) exposure to covid-19: Secondary | ICD-10-CM | POA: Diagnosis not present

## 2021-09-09 DIAGNOSIS — B078 Other viral warts: Secondary | ICD-10-CM | POA: Diagnosis not present

## 2021-09-09 DIAGNOSIS — L821 Other seborrheic keratosis: Secondary | ICD-10-CM | POA: Diagnosis not present

## 2021-09-09 DIAGNOSIS — D229 Melanocytic nevi, unspecified: Secondary | ICD-10-CM | POA: Diagnosis not present

## 2021-09-09 DIAGNOSIS — D1801 Hemangioma of skin and subcutaneous tissue: Secondary | ICD-10-CM | POA: Diagnosis not present

## 2021-09-09 DIAGNOSIS — L578 Other skin changes due to chronic exposure to nonionizing radiation: Secondary | ICD-10-CM | POA: Diagnosis not present

## 2021-09-09 DIAGNOSIS — L814 Other melanin hyperpigmentation: Secondary | ICD-10-CM | POA: Diagnosis not present

## 2021-09-15 DIAGNOSIS — Z20822 Contact with and (suspected) exposure to covid-19: Secondary | ICD-10-CM | POA: Diagnosis not present

## 2021-09-22 DIAGNOSIS — Z20822 Contact with and (suspected) exposure to covid-19: Secondary | ICD-10-CM | POA: Diagnosis not present

## 2022-01-18 DIAGNOSIS — L600 Ingrowing nail: Secondary | ICD-10-CM | POA: Diagnosis not present

## 2022-02-11 DIAGNOSIS — R739 Hyperglycemia, unspecified: Secondary | ICD-10-CM | POA: Diagnosis not present

## 2022-02-11 DIAGNOSIS — F419 Anxiety disorder, unspecified: Secondary | ICD-10-CM | POA: Diagnosis not present

## 2022-02-11 DIAGNOSIS — Z23 Encounter for immunization: Secondary | ICD-10-CM | POA: Diagnosis not present

## 2022-02-11 DIAGNOSIS — E7800 Pure hypercholesterolemia, unspecified: Secondary | ICD-10-CM | POA: Diagnosis not present

## 2022-02-11 DIAGNOSIS — K219 Gastro-esophageal reflux disease without esophagitis: Secondary | ICD-10-CM | POA: Diagnosis not present

## 2022-02-11 DIAGNOSIS — I10 Essential (primary) hypertension: Secondary | ICD-10-CM | POA: Diagnosis not present

## 2022-02-11 DIAGNOSIS — F5101 Primary insomnia: Secondary | ICD-10-CM | POA: Diagnosis not present

## 2022-02-11 DIAGNOSIS — N189 Chronic kidney disease, unspecified: Secondary | ICD-10-CM | POA: Diagnosis not present

## 2022-02-11 DIAGNOSIS — F321 Major depressive disorder, single episode, moderate: Secondary | ICD-10-CM | POA: Diagnosis not present

## 2022-02-11 DIAGNOSIS — N1831 Chronic kidney disease, stage 3a: Secondary | ICD-10-CM | POA: Diagnosis not present

## 2022-02-11 DIAGNOSIS — N319 Neuromuscular dysfunction of bladder, unspecified: Secondary | ICD-10-CM | POA: Diagnosis not present

## 2022-02-17 DIAGNOSIS — N312 Flaccid neuropathic bladder, not elsewhere classified: Secondary | ICD-10-CM | POA: Diagnosis not present

## 2022-02-17 DIAGNOSIS — N3000 Acute cystitis without hematuria: Secondary | ICD-10-CM | POA: Diagnosis not present

## 2022-07-26 DIAGNOSIS — J329 Chronic sinusitis, unspecified: Secondary | ICD-10-CM | POA: Diagnosis not present

## 2022-07-26 DIAGNOSIS — H9122 Sudden idiopathic hearing loss, left ear: Secondary | ICD-10-CM | POA: Diagnosis not present

## 2022-08-02 IMAGING — CR DG ABDOMEN ACUTE W/ 1V CHEST
4 series · 4 of 4 positions shown · non-contrast
Comparison: No prior.

CLINICAL DATA: Abdominal fullness.  Constipation.

EXAM:
DG ABDOMEN ACUTE WITH 1 VIEW CHEST

[w chest pa]
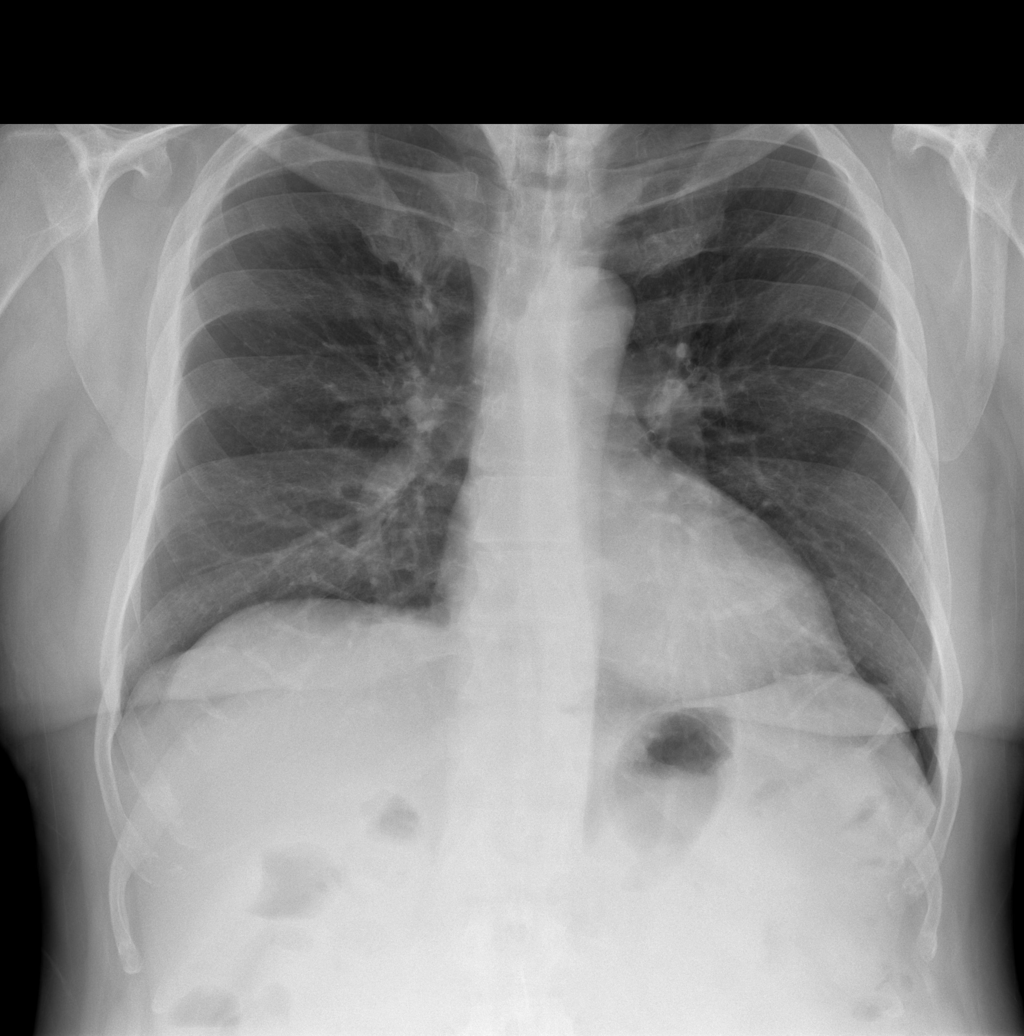

[w abdomen upright]
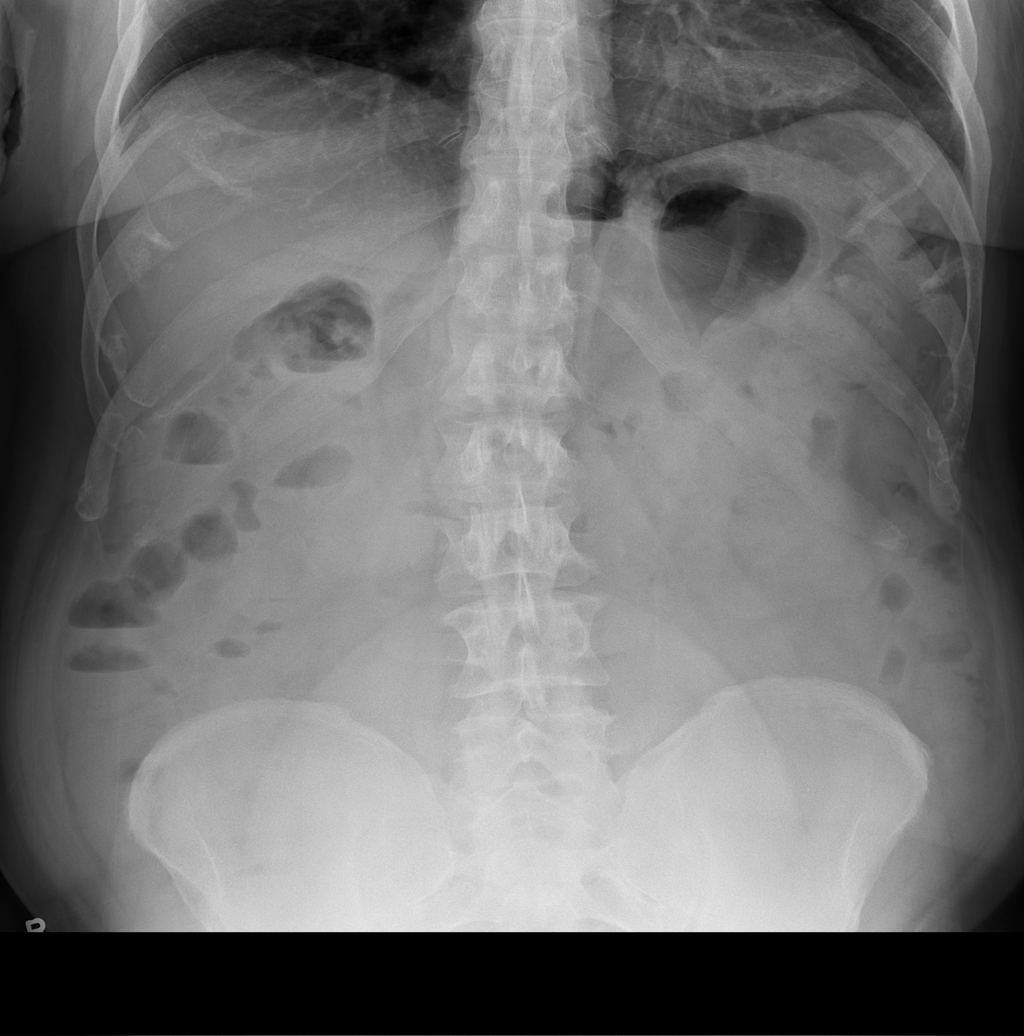

[t abdomen supine (1 of 2)]
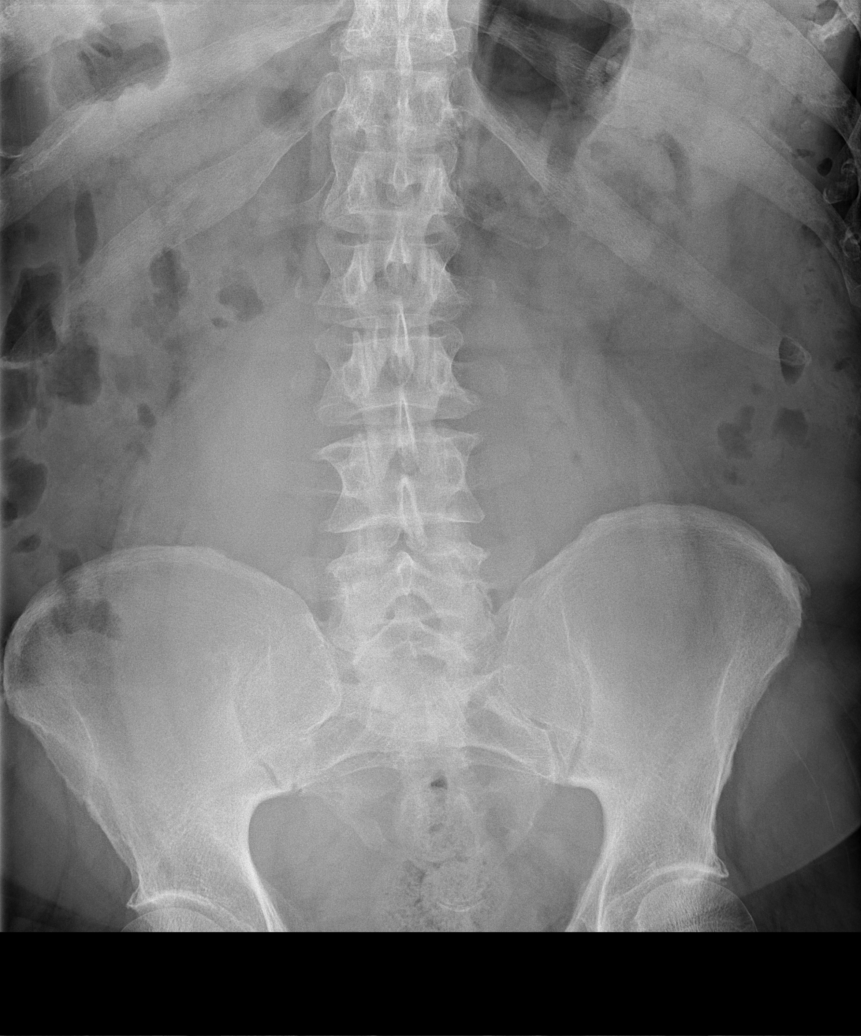

[t abdomen supine (2 of 2)]
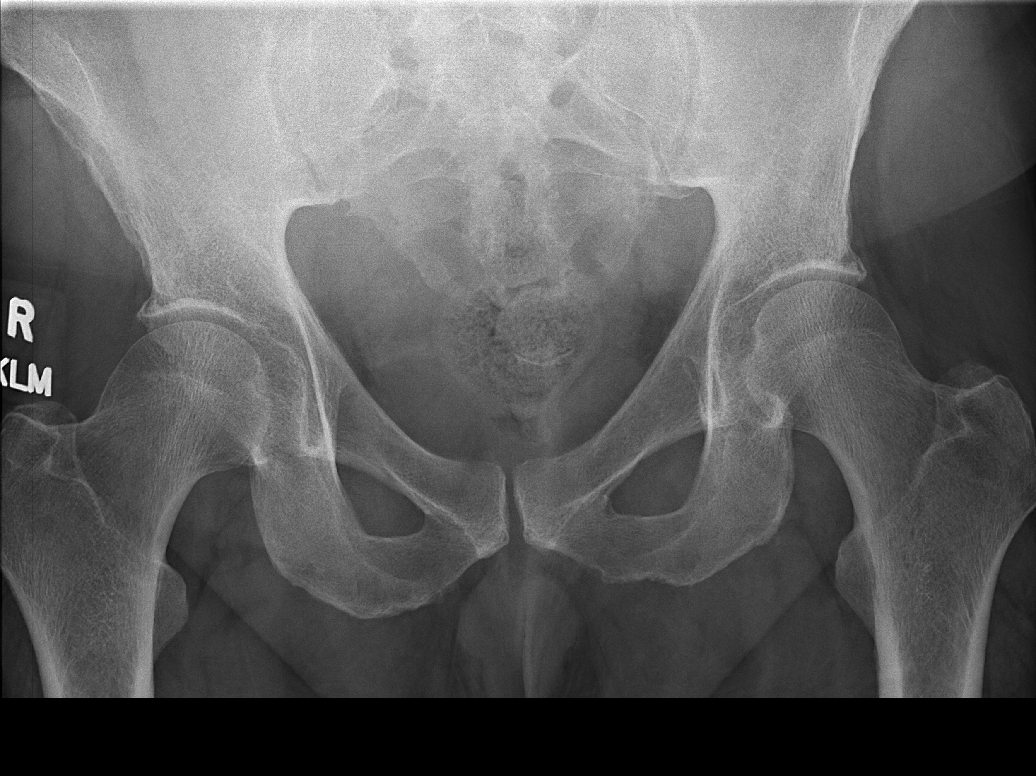

[4 of 4 positions shown; findings below may reference images not displayed]

FINDINGS: Mediastinum and hilar structures normal. Lungs are clear. No pleural
effusion pneumothorax. Heart size normal. Soft tissue fullness noted
projected over the ileus psoas regions bilaterally. Although this
may represent prominent iliopsoas muscles, to exclude a mass such as
a retroperitoneal mass, contrast-enhanced CT of the abdomen pelvis
suggested to further evaluate. No bowel distention. Prominent amount
of stool noted throughout colon. No free air. Degenerative change
lumbar spine and both hips.
IMPRESSION: 1. No acute cardiopulmonary disease.
2. Prominent amount of stool noted throughout the colon.
3. Soft tissue fullness noted projected over the iliopsoas regions
bilaterally. Although this may represent prominent iliopsoas
muscles, to exclude a mass such as a retroperitoneal mass,
contrast-enhanced CT of the abdomen pelvis suggested to further
evaluate.

## 2022-08-11 DIAGNOSIS — N319 Neuromuscular dysfunction of bladder, unspecified: Secondary | ICD-10-CM | POA: Diagnosis not present

## 2022-08-11 DIAGNOSIS — F5101 Primary insomnia: Secondary | ICD-10-CM | POA: Diagnosis not present

## 2022-08-11 DIAGNOSIS — R7303 Prediabetes: Secondary | ICD-10-CM | POA: Diagnosis not present

## 2022-08-11 DIAGNOSIS — I10 Essential (primary) hypertension: Secondary | ICD-10-CM | POA: Diagnosis not present

## 2022-08-11 DIAGNOSIS — H6123 Impacted cerumen, bilateral: Secondary | ICD-10-CM | POA: Diagnosis not present

## 2022-08-11 DIAGNOSIS — E7800 Pure hypercholesterolemia, unspecified: Secondary | ICD-10-CM | POA: Diagnosis not present

## 2022-08-11 DIAGNOSIS — Z125 Encounter for screening for malignant neoplasm of prostate: Secondary | ICD-10-CM | POA: Diagnosis not present

## 2022-08-11 DIAGNOSIS — F419 Anxiety disorder, unspecified: Secondary | ICD-10-CM | POA: Diagnosis not present

## 2022-08-11 DIAGNOSIS — K219 Gastro-esophageal reflux disease without esophagitis: Secondary | ICD-10-CM | POA: Diagnosis not present

## 2022-08-11 DIAGNOSIS — F321 Major depressive disorder, single episode, moderate: Secondary | ICD-10-CM | POA: Diagnosis not present

## 2022-08-11 DIAGNOSIS — N189 Chronic kidney disease, unspecified: Secondary | ICD-10-CM | POA: Diagnosis not present

## 2022-08-11 DIAGNOSIS — Z0000 Encounter for general adult medical examination without abnormal findings: Secondary | ICD-10-CM | POA: Diagnosis not present

## 2022-09-13 DIAGNOSIS — L814 Other melanin hyperpigmentation: Secondary | ICD-10-CM | POA: Diagnosis not present

## 2022-09-13 DIAGNOSIS — L578 Other skin changes due to chronic exposure to nonionizing radiation: Secondary | ICD-10-CM | POA: Diagnosis not present

## 2022-09-13 DIAGNOSIS — D1801 Hemangioma of skin and subcutaneous tissue: Secondary | ICD-10-CM | POA: Diagnosis not present

## 2022-09-13 DIAGNOSIS — L821 Other seborrheic keratosis: Secondary | ICD-10-CM | POA: Diagnosis not present

## 2022-09-14 DIAGNOSIS — R339 Retention of urine, unspecified: Secondary | ICD-10-CM | POA: Diagnosis not present

## 2022-09-14 DIAGNOSIS — N312 Flaccid neuropathic bladder, not elsewhere classified: Secondary | ICD-10-CM | POA: Diagnosis not present

## 2022-09-14 DIAGNOSIS — N401 Enlarged prostate with lower urinary tract symptoms: Secondary | ICD-10-CM | POA: Diagnosis not present

## 2022-09-14 DIAGNOSIS — R972 Elevated prostate specific antigen [PSA]: Secondary | ICD-10-CM | POA: Diagnosis not present

## 2022-09-30 DIAGNOSIS — H9222 Otorrhagia, left ear: Secondary | ICD-10-CM | POA: Diagnosis not present

## 2022-09-30 DIAGNOSIS — H9122 Sudden idiopathic hearing loss, left ear: Secondary | ICD-10-CM | POA: Diagnosis not present

## 2022-09-30 DIAGNOSIS — H9113 Presbycusis, bilateral: Secondary | ICD-10-CM | POA: Insufficient documentation

## 2022-09-30 DIAGNOSIS — H906 Mixed conductive and sensorineural hearing loss, bilateral: Secondary | ICD-10-CM | POA: Diagnosis not present

## 2022-09-30 DIAGNOSIS — H6993 Unspecified Eustachian tube disorder, bilateral: Secondary | ICD-10-CM | POA: Diagnosis not present

## 2022-10-07 DIAGNOSIS — H02831 Dermatochalasis of right upper eyelid: Secondary | ICD-10-CM | POA: Diagnosis not present

## 2022-10-07 DIAGNOSIS — H524 Presbyopia: Secondary | ICD-10-CM | POA: Diagnosis not present

## 2022-10-07 DIAGNOSIS — H25813 Combined forms of age-related cataract, bilateral: Secondary | ICD-10-CM | POA: Diagnosis not present

## 2022-10-07 DIAGNOSIS — H02834 Dermatochalasis of left upper eyelid: Secondary | ICD-10-CM | POA: Diagnosis not present

## 2022-11-24 DIAGNOSIS — R310 Gross hematuria: Secondary | ICD-10-CM | POA: Diagnosis not present

## 2022-11-24 DIAGNOSIS — N3000 Acute cystitis without hematuria: Secondary | ICD-10-CM | POA: Diagnosis not present

## 2022-12-02 DIAGNOSIS — N312 Flaccid neuropathic bladder, not elsewhere classified: Secondary | ICD-10-CM | POA: Diagnosis not present

## 2022-12-02 DIAGNOSIS — R310 Gross hematuria: Secondary | ICD-10-CM | POA: Diagnosis not present

## 2023-02-09 DIAGNOSIS — F321 Major depressive disorder, single episode, moderate: Secondary | ICD-10-CM | POA: Diagnosis not present

## 2023-02-09 DIAGNOSIS — Z23 Encounter for immunization: Secondary | ICD-10-CM | POA: Diagnosis not present

## 2023-02-09 DIAGNOSIS — N319 Neuromuscular dysfunction of bladder, unspecified: Secondary | ICD-10-CM | POA: Diagnosis not present

## 2023-02-09 DIAGNOSIS — F419 Anxiety disorder, unspecified: Secondary | ICD-10-CM | POA: Diagnosis not present

## 2023-02-09 DIAGNOSIS — N189 Chronic kidney disease, unspecified: Secondary | ICD-10-CM | POA: Diagnosis not present

## 2023-02-09 DIAGNOSIS — F5101 Primary insomnia: Secondary | ICD-10-CM | POA: Diagnosis not present

## 2023-02-09 DIAGNOSIS — R7303 Prediabetes: Secondary | ICD-10-CM | POA: Diagnosis not present

## 2023-02-09 DIAGNOSIS — I10 Essential (primary) hypertension: Secondary | ICD-10-CM | POA: Diagnosis not present

## 2023-02-09 DIAGNOSIS — E7800 Pure hypercholesterolemia, unspecified: Secondary | ICD-10-CM | POA: Diagnosis not present

## 2023-02-09 DIAGNOSIS — K219 Gastro-esophageal reflux disease without esophagitis: Secondary | ICD-10-CM | POA: Diagnosis not present

## 2023-03-08 DIAGNOSIS — R972 Elevated prostate specific antigen [PSA]: Secondary | ICD-10-CM | POA: Diagnosis not present

## 2023-03-15 DIAGNOSIS — R972 Elevated prostate specific antigen [PSA]: Secondary | ICD-10-CM | POA: Diagnosis not present

## 2023-03-15 DIAGNOSIS — N312 Flaccid neuropathic bladder, not elsewhere classified: Secondary | ICD-10-CM | POA: Diagnosis not present

## 2023-03-15 DIAGNOSIS — R300 Dysuria: Secondary | ICD-10-CM | POA: Diagnosis not present

## 2023-03-15 DIAGNOSIS — R3914 Feeling of incomplete bladder emptying: Secondary | ICD-10-CM | POA: Diagnosis not present

## 2023-03-15 DIAGNOSIS — N401 Enlarged prostate with lower urinary tract symptoms: Secondary | ICD-10-CM | POA: Diagnosis not present

## 2023-04-04 DIAGNOSIS — M1711 Unilateral primary osteoarthritis, right knee: Secondary | ICD-10-CM | POA: Diagnosis not present

## 2023-05-04 DIAGNOSIS — M1711 Unilateral primary osteoarthritis, right knee: Secondary | ICD-10-CM | POA: Diagnosis not present

## 2023-06-01 DIAGNOSIS — N312 Flaccid neuropathic bladder, not elsewhere classified: Secondary | ICD-10-CM | POA: Diagnosis not present

## 2023-06-01 DIAGNOSIS — R338 Other retention of urine: Secondary | ICD-10-CM | POA: Diagnosis not present

## 2023-06-01 DIAGNOSIS — N401 Enlarged prostate with lower urinary tract symptoms: Secondary | ICD-10-CM | POA: Diagnosis not present

## 2023-07-29 DIAGNOSIS — I10 Essential (primary) hypertension: Secondary | ICD-10-CM | POA: Diagnosis not present

## 2023-07-29 DIAGNOSIS — N189 Chronic kidney disease, unspecified: Secondary | ICD-10-CM | POA: Diagnosis not present

## 2023-08-15 DIAGNOSIS — N189 Chronic kidney disease, unspecified: Secondary | ICD-10-CM | POA: Diagnosis not present

## 2023-08-15 DIAGNOSIS — F419 Anxiety disorder, unspecified: Secondary | ICD-10-CM | POA: Diagnosis not present

## 2023-08-15 DIAGNOSIS — R0602 Shortness of breath: Secondary | ICD-10-CM | POA: Diagnosis not present

## 2023-08-15 DIAGNOSIS — N319 Neuromuscular dysfunction of bladder, unspecified: Secondary | ICD-10-CM | POA: Diagnosis not present

## 2023-08-15 DIAGNOSIS — K219 Gastro-esophageal reflux disease without esophagitis: Secondary | ICD-10-CM | POA: Diagnosis not present

## 2023-08-15 DIAGNOSIS — F321 Major depressive disorder, single episode, moderate: Secondary | ICD-10-CM | POA: Diagnosis not present

## 2023-08-15 DIAGNOSIS — R7303 Prediabetes: Secondary | ICD-10-CM | POA: Diagnosis not present

## 2023-08-15 DIAGNOSIS — F5101 Primary insomnia: Secondary | ICD-10-CM | POA: Diagnosis not present

## 2023-08-15 DIAGNOSIS — I10 Essential (primary) hypertension: Secondary | ICD-10-CM | POA: Diagnosis not present

## 2023-08-15 DIAGNOSIS — Z0000 Encounter for general adult medical examination without abnormal findings: Secondary | ICD-10-CM | POA: Diagnosis not present

## 2023-08-15 DIAGNOSIS — E7800 Pure hypercholesterolemia, unspecified: Secondary | ICD-10-CM | POA: Diagnosis not present

## 2023-08-15 DIAGNOSIS — Z23 Encounter for immunization: Secondary | ICD-10-CM | POA: Diagnosis not present

## 2023-08-15 DIAGNOSIS — R8281 Pyuria: Secondary | ICD-10-CM | POA: Diagnosis not present

## 2023-08-22 DIAGNOSIS — F321 Major depressive disorder, single episode, moderate: Secondary | ICD-10-CM | POA: Diagnosis not present

## 2023-08-22 DIAGNOSIS — E7800 Pure hypercholesterolemia, unspecified: Secondary | ICD-10-CM | POA: Diagnosis not present

## 2023-08-22 DIAGNOSIS — I10 Essential (primary) hypertension: Secondary | ICD-10-CM | POA: Diagnosis not present

## 2023-08-22 DIAGNOSIS — R972 Elevated prostate specific antigen [PSA]: Secondary | ICD-10-CM | POA: Diagnosis not present

## 2023-08-22 DIAGNOSIS — N189 Chronic kidney disease, unspecified: Secondary | ICD-10-CM | POA: Diagnosis not present

## 2023-08-23 DIAGNOSIS — R7303 Prediabetes: Secondary | ICD-10-CM | POA: Insufficient documentation

## 2023-08-23 DIAGNOSIS — Z8249 Family history of ischemic heart disease and other diseases of the circulatory system: Secondary | ICD-10-CM | POA: Insufficient documentation

## 2023-08-23 DIAGNOSIS — G4733 Obstructive sleep apnea (adult) (pediatric): Secondary | ICD-10-CM | POA: Diagnosis not present

## 2023-08-23 DIAGNOSIS — I10 Essential (primary) hypertension: Secondary | ICD-10-CM | POA: Insufficient documentation

## 2023-08-23 DIAGNOSIS — R0602 Shortness of breath: Secondary | ICD-10-CM | POA: Insufficient documentation

## 2023-08-30 DIAGNOSIS — R972 Elevated prostate specific antigen [PSA]: Secondary | ICD-10-CM | POA: Diagnosis not present

## 2023-08-30 DIAGNOSIS — N312 Flaccid neuropathic bladder, not elsewhere classified: Secondary | ICD-10-CM | POA: Diagnosis not present

## 2023-09-13 DIAGNOSIS — I10 Essential (primary) hypertension: Secondary | ICD-10-CM | POA: Diagnosis not present

## 2023-09-13 DIAGNOSIS — N189 Chronic kidney disease, unspecified: Secondary | ICD-10-CM | POA: Diagnosis not present

## 2023-09-21 DIAGNOSIS — E7800 Pure hypercholesterolemia, unspecified: Secondary | ICD-10-CM | POA: Diagnosis not present

## 2023-09-21 DIAGNOSIS — F321 Major depressive disorder, single episode, moderate: Secondary | ICD-10-CM | POA: Diagnosis not present

## 2023-09-21 DIAGNOSIS — I10 Essential (primary) hypertension: Secondary | ICD-10-CM | POA: Diagnosis not present

## 2023-09-21 DIAGNOSIS — N189 Chronic kidney disease, unspecified: Secondary | ICD-10-CM | POA: Diagnosis not present

## 2023-09-22 DIAGNOSIS — R0609 Other forms of dyspnea: Secondary | ICD-10-CM | POA: Diagnosis not present

## 2023-09-27 DIAGNOSIS — D485 Neoplasm of uncertain behavior of skin: Secondary | ICD-10-CM | POA: Diagnosis not present

## 2023-09-27 DIAGNOSIS — L820 Inflamed seborrheic keratosis: Secondary | ICD-10-CM | POA: Diagnosis not present

## 2023-09-27 DIAGNOSIS — L578 Other skin changes due to chronic exposure to nonionizing radiation: Secondary | ICD-10-CM | POA: Diagnosis not present

## 2023-09-27 DIAGNOSIS — D1801 Hemangioma of skin and subcutaneous tissue: Secondary | ICD-10-CM | POA: Diagnosis not present

## 2023-09-27 DIAGNOSIS — L814 Other melanin hyperpigmentation: Secondary | ICD-10-CM | POA: Diagnosis not present

## 2023-09-27 DIAGNOSIS — L821 Other seborrheic keratosis: Secondary | ICD-10-CM | POA: Diagnosis not present

## 2023-09-27 DIAGNOSIS — D229 Melanocytic nevi, unspecified: Secondary | ICD-10-CM | POA: Diagnosis not present

## 2023-10-07 DIAGNOSIS — H02834 Dermatochalasis of left upper eyelid: Secondary | ICD-10-CM | POA: Diagnosis not present

## 2023-10-07 DIAGNOSIS — H25813 Combined forms of age-related cataract, bilateral: Secondary | ICD-10-CM | POA: Diagnosis not present

## 2023-10-07 DIAGNOSIS — H43813 Vitreous degeneration, bilateral: Secondary | ICD-10-CM | POA: Diagnosis not present

## 2023-10-07 DIAGNOSIS — H524 Presbyopia: Secondary | ICD-10-CM | POA: Diagnosis not present

## 2023-10-07 DIAGNOSIS — H02831 Dermatochalasis of right upper eyelid: Secondary | ICD-10-CM | POA: Diagnosis not present

## 2023-10-13 DIAGNOSIS — N189 Chronic kidney disease, unspecified: Secondary | ICD-10-CM | POA: Diagnosis not present

## 2023-10-13 DIAGNOSIS — I10 Essential (primary) hypertension: Secondary | ICD-10-CM | POA: Diagnosis not present

## 2023-10-20 DIAGNOSIS — R7303 Prediabetes: Secondary | ICD-10-CM | POA: Diagnosis not present

## 2023-10-20 DIAGNOSIS — G4733 Obstructive sleep apnea (adult) (pediatric): Secondary | ICD-10-CM | POA: Diagnosis not present

## 2023-10-20 DIAGNOSIS — R0609 Other forms of dyspnea: Secondary | ICD-10-CM | POA: Diagnosis not present

## 2023-10-20 DIAGNOSIS — I10 Essential (primary) hypertension: Secondary | ICD-10-CM | POA: Diagnosis not present

## 2023-10-22 DIAGNOSIS — E7800 Pure hypercholesterolemia, unspecified: Secondary | ICD-10-CM | POA: Diagnosis not present

## 2023-10-22 DIAGNOSIS — I10 Essential (primary) hypertension: Secondary | ICD-10-CM | POA: Diagnosis not present

## 2023-10-22 DIAGNOSIS — F321 Major depressive disorder, single episode, moderate: Secondary | ICD-10-CM | POA: Diagnosis not present

## 2023-10-22 DIAGNOSIS — N189 Chronic kidney disease, unspecified: Secondary | ICD-10-CM | POA: Diagnosis not present

## 2023-11-17 DIAGNOSIS — H6993 Unspecified Eustachian tube disorder, bilateral: Secondary | ICD-10-CM | POA: Insufficient documentation

## 2023-11-17 DIAGNOSIS — H906 Mixed conductive and sensorineural hearing loss, bilateral: Secondary | ICD-10-CM | POA: Diagnosis not present

## 2023-11-19 DIAGNOSIS — N189 Chronic kidney disease, unspecified: Secondary | ICD-10-CM | POA: Diagnosis not present

## 2023-11-19 DIAGNOSIS — I10 Essential (primary) hypertension: Secondary | ICD-10-CM | POA: Diagnosis not present

## 2023-12-22 DIAGNOSIS — N189 Chronic kidney disease, unspecified: Secondary | ICD-10-CM | POA: Diagnosis not present

## 2023-12-22 DIAGNOSIS — F321 Major depressive disorder, single episode, moderate: Secondary | ICD-10-CM | POA: Diagnosis not present

## 2023-12-22 DIAGNOSIS — E7800 Pure hypercholesterolemia, unspecified: Secondary | ICD-10-CM | POA: Diagnosis not present

## 2023-12-22 DIAGNOSIS — I10 Essential (primary) hypertension: Secondary | ICD-10-CM | POA: Diagnosis not present

## 2023-12-28 DIAGNOSIS — Z111 Encounter for screening for respiratory tuberculosis: Secondary | ICD-10-CM | POA: Diagnosis not present

## 2023-12-31 DIAGNOSIS — N189 Chronic kidney disease, unspecified: Secondary | ICD-10-CM | POA: Diagnosis not present

## 2023-12-31 DIAGNOSIS — I10 Essential (primary) hypertension: Secondary | ICD-10-CM | POA: Diagnosis not present

## 2024-01-03 ENCOUNTER — Ambulatory Visit (INDEPENDENT_AMBULATORY_CARE_PROVIDER_SITE_OTHER): Admitting: Podiatry

## 2024-01-03 DIAGNOSIS — L600 Ingrowing nail: Secondary | ICD-10-CM

## 2024-01-03 DIAGNOSIS — G4700 Insomnia, unspecified: Secondary | ICD-10-CM | POA: Insufficient documentation

## 2024-01-03 DIAGNOSIS — F32A Depression, unspecified: Secondary | ICD-10-CM | POA: Insufficient documentation

## 2024-01-03 DIAGNOSIS — N400 Enlarged prostate without lower urinary tract symptoms: Secondary | ICD-10-CM | POA: Insufficient documentation

## 2024-01-03 MED ORDER — NEOMYCIN-POLYMYXIN-HC 3.5-10000-1 OT SOLN: OTIC | 0 refills | Status: AC

## 2024-01-03 MED ORDER — CEPHALEXIN 500 MG PO CAPS: 500.0000 mg | ORAL_CAPSULE | Freq: Three times a day (TID) | ORAL | 0 refills | Status: AC

## 2024-01-03 NOTE — Patient Instructions (Signed)
 Betadine Soak Instructions  Purchase an 8 oz. bottle of BETADINE solution (Povidone)  THE DAY AFTER THE PROCEDURE  Place 1 tablespoon of betadine solution in a quart of warm tap water.  Submerge your foot or feet with outer bandage intact for the initial soak; this will allow the bandage to become moist and wet for easy lift off.  Once you remove your bandage, continue to soak in the solution for 20 minutes.  This soak should be done twice a day.  Next, remove your foot or feet from solution, blot dry the affected area and cover.  You may use a band aid large enough to cover the area or use gauze and tape.  Apply other medications to the area as directed by the doctor such as cortisporin otic solution (ear drops) or neosporin.  IF YOUR SKIN BECOMES IRRITATED WHILE USING THESE INSTRUCTIONS, IT IS OKAY TO SWITCH TO EPSOM SALTS AND WATER OR WHITE VINEGAR AND WATER.   Long Term Care Instructions-Post Nail Surgery  You have had your ingrown toenail and root treated with a chemical.  This chemical causes a burn that will drain and ooze like a blister.  This can drain for 6-8 weeks or longer.  It is important to keep this area clean, covered, and follow the soaking instructions dispensed at the time of your surgery.  This area will eventually dry and form a scab.  Once the scab forms you no longer need to soak or apply a dressing.  If at any time you experience an increase in pain, redness, swelling, or drainage, you should contact the office as soon as possible.   Epsom Salt Soak Instructions    IF SOAKING IS STILL NECESSARY, START THIS 2 WEEKS AFTER INITIAL PROCEDURE   Place 1/4 cup of epsom salts in a quart of warm tap water.  Soak your foot or feet in the solution for 20 minutes twice a day until you notice the area has dried and a scab has formed. Continue to apply other medications to the area as directed by the doctor such as polysporin, neosporin or cortisporin drops.  IF YOUR SKIN BECOMES  IRRITATED WHILE USING THESE INSTRUCTIONS, IT IS OKAY TO SWITCH TO  WHITE VINEGAR AND WATER. Or you may use antibacterial soap and water to keep the toe clean  Monitor for any signs/symptoms of infection. Call the office immediately if any occur or go directly to the emergency room. Call with any questions/concerns.

## 2024-01-04 NOTE — Progress Notes (Signed)
  Subjective:  Patient ID: Walter Combs, male    DOB: 04-21-1951,  MRN: [id number] HPI Chief Complaint  Patient presents with   Ingrown Toenail    Right 1st, had partial removal 3 years ago. Began flaring up a month ago. Red and painful, but no drainage. Not diabetic and not on anticoag therapy.     73 y.o. male presents with the above complaint.   ROS: Denies fever chills nausea mobic muscle aches pains calf pain back pain chest pain shortness of breath.  No past medical history on file.   Current Outpatient Medications:    cephALEXin  (KEFLEX ) 500 MG capsule, Take 1 capsule (500 mg total) by mouth 3 (three) times daily., Disp: 30 capsule, Rfl: 0   clonazePAM (KLONOPIN) 1 MG tablet, Take 1 mg by mouth at bedtime as needed., Disp: , Rfl:    doxycycline (VIBRAMYCIN) 100 MG capsule, Take 100 mg by mouth 2 (two) times daily., Disp: , Rfl:    DULoxetine (CYMBALTA) 30 MG capsule, Take 90 mg by mouth daily., Disp: , Rfl:    famotidine (PEPCID) 20 MG tablet, Take 20 mg by mouth daily., Disp: , Rfl:    lisinopril (ZESTRIL) 5 MG tablet, Take 5 mg by mouth daily., Disp: , Rfl:    mirtazapine (REMERON) 30 MG tablet, Take 30 mg by mouth at bedtime., Disp: , Rfl:    neomycin -polymyxin-hydrocortisone (CORTISPORIN) OTIC solution, Apply one to two drops to toe after soaking twice daily., Disp: 10 mL, Rfl: 0   rosuvastatin (CRESTOR) 10 MG tablet, Take 10 mg by mouth daily., Disp: , Rfl:   No Known Allergies Review of Systems Objective:  There were no vitals filed for this visit.  General: Well developed, nourished, in no acute distress, alert and oriented x3   Dermatological: Skin is warm, dry and supple bilateral. Nails x 10 are well maintained; remaining integument appears unremarkable at this time. There are no open sores, no preulcerative lesions, no rash or signs of infection present.  Hallux nail right is thick dark and irregularly shaped with surrounding erythema to the level of the  interphalangeal joint.  The nail is loose distally.  Vascular: Dorsalis Pedis artery and Posterior Tibial artery pedal pulses are 2/4 bilateral with immedate capillary fill time. Pedal hair growth present. No varicosities and no lower extremity edema present bilateral.   Neruologic: Grossly intact via light touch bilateral. Vibratory intact via tuning fork bilateral. Protective threshold with Semmes Wienstein monofilament intact to all pedal sites bilateral. Patellar and Achilles deep tendon reflexes 2+ bilateral. No Babinski or clonus noted bilateral.   Musculoskeletal: No gross boney pedal deformities bilateral. No pain, crepitus, or limitation noted with foot and ankle range of motion bilateral. Muscular strength 5/5 in all groups tested bilateral.  Gait: Unassisted, Nonantalgic.    Radiographs:  None taken  Assessment & Plan:   Assessment: Ingrown nail hallux right with cellulitis  Plan: Total nail avulsion was performed today after local anesthetic was administered he tolerated this procedure well after sterile Betadine skin prep.  He was given both oral and written home-going instructions for the care of soaking of the toe as well as a prescription for Cortisporin Otic to be applied twice daily after soaking as well as an oral prescription for Keflex  500 mg 1 p.o. 3 times daily.     Mizani Dilday T. [place], [place]

## 2024-01-16 DIAGNOSIS — M1711 Unilateral primary osteoarthritis, right knee: Secondary | ICD-10-CM | POA: Diagnosis not present

## 2024-01-17 ENCOUNTER — Ambulatory Visit (INDEPENDENT_AMBULATORY_CARE_PROVIDER_SITE_OTHER): Admitting: Podiatry

## 2024-01-17 DIAGNOSIS — L600 Ingrowing nail: Secondary | ICD-10-CM

## 2024-01-18 NOTE — Progress Notes (Signed)
 He presents today for follow-up of his nail avulsion hallux right.  Denies fever chills nausea vomit states that is doing just great continues to soak.  Objective: Vital signs stable alert oriented x 3 nailbed appears to be healing very nicely is no erythema cellulitis drainage or odor.  Assessment: Well-healing surgical toe hallux right.  Plan: Follow-up with me on an as-needed basis.

## 2024-01-22 DIAGNOSIS — E7800 Pure hypercholesterolemia, unspecified: Secondary | ICD-10-CM | POA: Diagnosis not present

## 2024-01-22 DIAGNOSIS — F321 Major depressive disorder, single episode, moderate: Secondary | ICD-10-CM | POA: Diagnosis not present

## 2024-01-22 DIAGNOSIS — I10 Essential (primary) hypertension: Secondary | ICD-10-CM | POA: Diagnosis not present

## 2024-01-22 DIAGNOSIS — N189 Chronic kidney disease, unspecified: Secondary | ICD-10-CM | POA: Diagnosis not present

## 2024-01-30 DIAGNOSIS — N189 Chronic kidney disease, unspecified: Secondary | ICD-10-CM | POA: Diagnosis not present

## 2024-01-30 DIAGNOSIS — I10 Essential (primary) hypertension: Secondary | ICD-10-CM | POA: Diagnosis not present

## 2024-02-21 DIAGNOSIS — E7800 Pure hypercholesterolemia, unspecified: Secondary | ICD-10-CM | POA: Diagnosis not present

## 2024-02-21 DIAGNOSIS — N189 Chronic kidney disease, unspecified: Secondary | ICD-10-CM | POA: Diagnosis not present

## 2024-02-21 DIAGNOSIS — F321 Major depressive disorder, single episode, moderate: Secondary | ICD-10-CM | POA: Diagnosis not present

## 2024-02-21 DIAGNOSIS — I10 Essential (primary) hypertension: Secondary | ICD-10-CM | POA: Diagnosis not present

## 2024-02-23 DIAGNOSIS — R972 Elevated prostate specific antigen [PSA]: Secondary | ICD-10-CM | POA: Diagnosis not present

## 2024-02-29 DIAGNOSIS — N189 Chronic kidney disease, unspecified: Secondary | ICD-10-CM | POA: Diagnosis not present

## 2024-02-29 DIAGNOSIS — I10 Essential (primary) hypertension: Secondary | ICD-10-CM | POA: Diagnosis not present

## 2024-02-29 DIAGNOSIS — Z23 Encounter for immunization: Secondary | ICD-10-CM | POA: Diagnosis not present

## 2024-03-01 ENCOUNTER — Ambulatory Visit (INDEPENDENT_AMBULATORY_CARE_PROVIDER_SITE_OTHER): Admitting: Podiatry

## 2024-03-01 ENCOUNTER — Ambulatory Visit (INDEPENDENT_AMBULATORY_CARE_PROVIDER_SITE_OTHER)

## 2024-03-01 DIAGNOSIS — N400 Enlarged prostate without lower urinary tract symptoms: Secondary | ICD-10-CM | POA: Diagnosis not present

## 2024-03-01 DIAGNOSIS — S93491A Sprain of other ligament of right ankle, initial encounter: Secondary | ICD-10-CM

## 2024-03-01 DIAGNOSIS — R972 Elevated prostate specific antigen [PSA]: Secondary | ICD-10-CM | POA: Diagnosis not present

## 2024-03-01 DIAGNOSIS — R350 Frequency of micturition: Secondary | ICD-10-CM | POA: Diagnosis not present

## 2024-03-01 DIAGNOSIS — M7742 Metatarsalgia, left foot: Secondary | ICD-10-CM | POA: Diagnosis not present

## 2024-03-01 DIAGNOSIS — R3914 Feeling of incomplete bladder emptying: Secondary | ICD-10-CM | POA: Diagnosis not present

## 2024-03-01 DIAGNOSIS — M19072 Primary osteoarthritis, left ankle and foot: Secondary | ICD-10-CM

## 2024-03-01 DIAGNOSIS — R351 Nocturia: Secondary | ICD-10-CM | POA: Diagnosis not present

## 2024-03-01 DIAGNOSIS — N401 Enlarged prostate with lower urinary tract symptoms: Secondary | ICD-10-CM | POA: Diagnosis not present

## 2024-03-01 DIAGNOSIS — R3912 Poor urinary stream: Secondary | ICD-10-CM | POA: Diagnosis not present

## 2024-03-01 MED ORDER — TRIAMCINOLONE ACETONIDE 40 MG/ML IJ SUSP
20.0000 mg | Freq: Once | INTRAMUSCULAR | Status: AC
  Administered 2024-03-01: 20 mg

## 2024-03-02 NOTE — Progress Notes (Signed)
 He presents today concerned about having rolled his right ankle.  He is also complaining about pain to the ball of the left foot.  Objective: Vital signs are stable alert oriented x 3.  Pulses are palpable.  Mild tenderness on palpation of the lateral and plantar lateral malleolar region of the right ankle.  The ankle does not appear to be unstable.  He has not minimal tenderness on palpation of the anterior talofibular ligament however he does have some tenderness on palpation of the calcaneofibular ligament.  He has pain on palpation and attempted range of motion at the second metatarsal phalangeal joint of the left foot.  Radiographs taken bilaterally today do not demonstrate any significant osseous abnormalities no acute findings are noted.  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  Plan: Discussed etiology pathology conservative surgical therapies at this point placed in a Tri-Lock brace to the right ankle.  Injected around the second metatarsal phalangeal joint with 20 mg Kenalog 5 mg Marcaine 40 maximal tenderness.  Tolerated procedure well without complications.  Follow-up with him in 1 month

## 2024-03-23 DIAGNOSIS — I10 Essential (primary) hypertension: Secondary | ICD-10-CM | POA: Diagnosis not present

## 2024-03-23 DIAGNOSIS — N189 Chronic kidney disease, unspecified: Secondary | ICD-10-CM | POA: Diagnosis not present

## 2024-03-23 DIAGNOSIS — E7800 Pure hypercholesterolemia, unspecified: Secondary | ICD-10-CM | POA: Diagnosis not present

## 2024-03-23 DIAGNOSIS — F321 Major depressive disorder, single episode, moderate: Secondary | ICD-10-CM | POA: Diagnosis not present

## 2024-04-10 ENCOUNTER — Ambulatory Visit: Admitting: Podiatry

## 2024-04-17 DIAGNOSIS — H8113 Benign paroxysmal vertigo, bilateral: Secondary | ICD-10-CM | POA: Diagnosis not present

## 2024-04-22 DIAGNOSIS — I10 Essential (primary) hypertension: Secondary | ICD-10-CM | POA: Diagnosis not present

## 2024-04-22 DIAGNOSIS — F321 Major depressive disorder, single episode, moderate: Secondary | ICD-10-CM | POA: Diagnosis not present

## 2024-04-22 DIAGNOSIS — N189 Chronic kidney disease, unspecified: Secondary | ICD-10-CM | POA: Diagnosis not present

## 2024-04-22 DIAGNOSIS — E7800 Pure hypercholesterolemia, unspecified: Secondary | ICD-10-CM | POA: Diagnosis not present

## 2024-04-25 DIAGNOSIS — M1711 Unilateral primary osteoarthritis, right knee: Secondary | ICD-10-CM | POA: Diagnosis not present
